# Patient Record
Sex: Female | Born: 1955
Health system: Southern US, Community
[De-identification: ages and names within clinical notes are randomized; demographics above are authoritative.]

## PROBLEM LIST (undated history)

## (undated) DIAGNOSIS — I10 Essential (primary) hypertension: Secondary | ICD-10-CM

---

## 2016-12-21 DIAGNOSIS — B001 Herpesviral vesicular dermatitis: Secondary | ICD-10-CM | POA: Diagnosis not present

## 2016-12-21 DIAGNOSIS — M545 Low back pain: Secondary | ICD-10-CM | POA: Diagnosis not present

## 2017-08-08 DIAGNOSIS — J101 Influenza due to other identified influenza virus with other respiratory manifestations: Secondary | ICD-10-CM | POA: Diagnosis not present

## 2018-08-14 ENCOUNTER — Emergency Department (HOSPITAL_COMMUNITY)
Admission: EM | Admit: 2018-08-14 | Discharge: 2018-08-14 | Disposition: A | Discharge status: Home / Self Care | Payer: BLUE CROSS/BLUE SHIELD | Attending: Emergency Medicine | Admitting: Emergency Medicine

## 2018-08-14 ENCOUNTER — Emergency Department (HOSPITAL_COMMUNITY): Payer: BLUE CROSS/BLUE SHIELD

## 2018-08-14 ENCOUNTER — Encounter (HOSPITAL_COMMUNITY): Payer: Self-pay

## 2018-08-14 ENCOUNTER — Other Ambulatory Visit: Payer: Self-pay

## 2018-08-14 DIAGNOSIS — I10 Essential (primary) hypertension: Secondary | ICD-10-CM | POA: Diagnosis not present

## 2018-08-14 DIAGNOSIS — R4182 Altered mental status, unspecified: Secondary | ICD-10-CM | POA: Insufficient documentation

## 2018-08-14 HISTORY — DX: Essential (primary) hypertension: I10

## 2018-08-14 LAB — COMPREHENSIVE METABOLIC PANEL
ALK PHOS: 123 U/L (ref 38–126)
ALT: 24 U/L (ref 0–44)
AST: 26 U/L (ref 15–41)
Albumin: 4.6 g/dL (ref 3.5–5.0)
Anion gap: 13 (ref 5–15)
BUN: 12 mg/dL (ref 8–23)
CO2: 24 mmol/L (ref 22–32)
Calcium: 9.9 mg/dL (ref 8.9–10.3)
Chloride: 103 mmol/L (ref 98–111)
Creatinine, Ser: 0.72 mg/dL (ref 0.44–1.00)
GFR calc Af Amer: >60 mL/min (ref >60)
GFR calc non Af Amer: >60 mL/min (ref >60)
Glucose, Bld: 108 mg/dL — ABNORMAL HIGH (ref 70–99)
Potassium: 4.5 mmol/L (ref 3.5–5.1)
Sodium: 140 mmol/L (ref 135–145)
TOTAL PROTEIN: 7.7 g/dL (ref 6.5–8.1)
Total Bilirubin: 0.6 mg/dL (ref 0.3–1.2)

## 2018-08-14 LAB — CBC WITH DIFFERENTIAL/PLATELET
Abs Immature Granulocytes: 0.03 10*3/uL (ref 0.00–0.07)
BASOS PCT: 0 %
Basophils Absolute: 0 10*3/uL (ref 0.0–0.1)
Eosinophils Absolute: 0.1 10*3/uL (ref 0.0–0.5)
Eosinophils Relative: 1 %
HCT: 49 % — ABNORMAL HIGH (ref 36.0–46.0)
Hemoglobin: 16.1 g/dL — ABNORMAL HIGH (ref 12.0–15.0)
Immature Granulocytes: 0 %
Lymphocytes Relative: 17 %
Lymphs Abs: 1.4 10*3/uL (ref 0.7–4.0)
MCH: 28.5 pg (ref 26.0–34.0)
MCHC: 32.9 g/dL (ref 30.0–36.0)
MCV: 86.7 fL (ref 80.0–100.0)
Monocytes Absolute: 0.6 10*3/uL (ref 0.1–1.0)
Monocytes Relative: 7 %
Neutro Abs: 6.3 10*3/uL (ref 1.7–7.7)
Neutrophils Relative %: 75 %
Platelets: 303 10*3/uL (ref 150–400)
RBC: 5.65 MIL/uL — ABNORMAL HIGH (ref 3.87–5.11)
RDW: 13.2 % (ref 11.5–15.5)
WBC: 8.4 10*3/uL (ref 4.0–10.5)
nRBC: 0 % (ref 0.0–0.2)

## 2018-08-14 LAB — CBG MONITORING, ED
Glucose-Capillary: 104 mg/dL — ABNORMAL HIGH (ref 70–99)
Glucose-Capillary: 82 mg/dL (ref 70–99)

## 2018-08-14 LAB — URINALYSIS, ROUTINE W REFLEX MICROSCOPIC
Bacteria, UA: NONE SEEN
Bilirubin Urine: NEGATIVE
Glucose, UA: NEGATIVE mg/dL
Hgb urine dipstick: NEGATIVE
Ketones, ur: 5 mg/dL — AB
Leukocytes,Ua: NEGATIVE
Nitrite: NEGATIVE
PROTEIN: 30 mg/dL — AB
Specific Gravity, Urine: 1.012 (ref 1.005–1.030)
pH: 8 (ref 5.0–8.0)

## 2018-08-14 LAB — RAPID URINE DRUG SCREEN, HOSP PERFORMED
Amphetamines: NOT DETECTED
BENZODIAZEPINES: NOT DETECTED
Barbiturates: NOT DETECTED
COCAINE: NOT DETECTED
Opiates: NOT DETECTED
Tetrahydrocannabinol: NOT DETECTED

## 2018-08-14 MED ORDER — SODIUM CHLORIDE 0.9 % IV SOLN
INTRAVENOUS | Status: DC
  Administered 2018-08-14: 15:00:00 via INTRAVENOUS

## 2018-08-14 MED ORDER — HYDROCHLOROTHIAZIDE 25 MG PO TABS: 25.0000 mg | ORAL_TABLET | Freq: Every day | ORAL | 1 refills | Status: DC

## 2018-08-14 NOTE — ED Notes (Signed)
Patient transported to CT 

## 2018-08-14 NOTE — ED Provider Notes (Addendum)
[facility] EMERGENCY DEPARTMENT Provider Note   CSN: [id number] Arrival date & time: 08/14/18  1410    History   Chief Complaint Chief Complaint  Patient presents with  . Altered Mental Status  . Hypertension    HPI Miranda Decker is a 62 y.o. female.     Patient brought in by EMS.  Patient last seen normal by her husband around 2300 last evening.  Patient contacted him her family members contact him at work that she seemed to be confused.  This was at 1030 this morning.  Apparently been calling family members.  Patient denies headache denies any visual changes speech problems.  She states that she feels confused.  Denies any weakness or sensory problems.  No chest pain no shortness of breath no abdominal pain.  No history of similar symptoms no new medications.  Patient is alert and will follow commands speech seems to be normal.     Past Medical History:  Diagnosis Date  . Hypertension     There are no active problems to display for this patient.   History reviewed. No pertinent surgical history.   OB History   No obstetric history on file.      Home Medications    Prior to Admission medications   Not on File    Family History No family history on file.  Social History Social History   Tobacco Use  . Smoking status: Never Smoker  Substance Use Topics  . Alcohol use: Never    Frequency: Never  . Drug use: Never     Allergies   Patient has no allergy information on record.   Review of Systems Review of Systems  Constitutional: Negative for chills and fever.  HENT: Negative for congestion, rhinorrhea and sore throat.   Eyes: Negative for photophobia, pain, redness and visual disturbance.  Respiratory: Negative for cough and shortness of breath.   Cardiovascular: Negative for chest pain and leg swelling.  Gastrointestinal: Negative for abdominal pain, diarrhea, nausea and vomiting.  Genitourinary: Negative for dysuria.   Musculoskeletal: Negative for back pain and neck pain.  Skin: Negative for rash.  Neurological: Negative for dizziness, syncope, facial asymmetry, speech difficulty, weakness, light-headedness, numbness and headaches.  Hematological: Does not bruise/bleed easily.  Psychiatric/Behavioral: Positive for confusion.     Physical Exam Updated Vital Signs BP (!) 206/98   Pulse 80   Temp 98.1 F (36.7 C) (Oral)   Resp 12   Ht 1.575 m (5\' 2" )   Wt 72.6 kg   SpO2 99%   BMI 29.26 kg/m   Physical Exam Vitals signs and nursing note reviewed.  Constitutional:      General: She is not in acute distress.    Appearance: She is well-developed.  HENT:     Head: Normocephalic and atraumatic.     Mouth/Throat:     Mouth: Mucous membranes are moist.  Eyes:     Extraocular Movements: Extraocular movements intact.     Conjunctiva/sclera: Conjunctivae normal.     Pupils: Pupils are equal, round, and reactive to light.  Neck:     Musculoskeletal: Normal range of motion and neck supple.  Cardiovascular:     Rate and Rhythm: Normal rate and regular rhythm.     Heart sounds: Normal heart sounds. No murmur.  Pulmonary:     Effort: Pulmonary effort is normal. No respiratory distress.     Breath sounds: Normal breath sounds.  Abdominal:     General: Bowel sounds are  normal.     Palpations: Abdomen is soft.     Tenderness: There is no abdominal tenderness.  Musculoskeletal: Normal range of motion.  Skin:    General: Skin is warm and dry.     Capillary Refill: Capillary refill takes less than 2 seconds.  Neurological:     General: No focal deficit present.     Mental Status: She is alert and oriented to person, place, and time.     Cranial Nerves: No cranial nerve deficit.     Sensory: No sensory deficit.     Motor: No weakness.     Coordination: Coordination normal.      ED Treatments / Results  Labs (all labs ordered are listed, but only abnormal results are displayed) Labs Reviewed   URINALYSIS, ROUTINE W REFLEX MICROSCOPIC - Abnormal; Notable for the following components:      Result Value   Ketones, ur 5 (*)    Protein, ur 30 (*)    All other components within normal limits  CBC WITH DIFFERENTIAL/PLATELET - Abnormal; Notable for the following components:   RBC 5.65 (*)    Hemoglobin 16.1 (*)    HCT 49.0 (*)    All other components within normal limits  RAPID URINE DRUG SCREEN, HOSP PERFORMED  COMPREHENSIVE METABOLIC PANEL  CBG MONITORING, ED    EKG None  Radiology Dg Chest 2 View  Result Date: 08/14/2018 CLINICAL DATA:  63 year old female with a history confusion EXAM: CHEST - 2 VIEW COMPARISON:  None. FINDINGS: The heart size and mediastinal contours are within normal limits. Both lungs are clear. The visualized skeletal structures are unremarkable. IMPRESSION: No active cardiopulmonary disease. Electronically Signed   By: Gilmer MorJaime  Wagner D.O.   On: 08/14/2018 15:14   Ct Head Wo Contrast  Result Date: 08/14/2018 CLINICAL DATA:  Altered level of consciousness EXAM: CT HEAD WITHOUT CONTRAST TECHNIQUE: Contiguous axial images were obtained from the base of the skull through the vertex without intravenous contrast. COMPARISON:  None. FINDINGS: Brain: No evidence of acute infarction, hemorrhage, hydrocephalus, extra-axial collection or mass lesion/mass effect. Vascular: No hyperdense vessel or unexpected calcification. Skull: Normal. Negative for fracture or focal lesion. Sinuses/Orbits: No acute finding. Other: None. IMPRESSION: Normal head CT Electronically Signed   By: Alcide CleverMark  Lukens M.D.   On: 08/14/2018 15:06    Procedures Procedures (including critical care time)  Medications Ordered in ED Medications  0.9 %  sodium chloride infusion ( Intravenous New Bag/Given 08/14/18 1525)     Initial Impression / Assessment and Plan / ED Course  I have reviewed the triage vital signs and the nursing notes.  Pertinent labs & imaging results that were available during my  care of the patient were reviewed by me and considered in my medical decision making (see chart for details).       Patient with altered mental status but upon arrival here was alert with follow commands.  Patient states she felt confused and head.  Head CT without any acute findings.  Patient has a history of hypertension.  Blood pressure is elevated here 235/115.   Work-up without any acute findings CBC no leukocytosis chest x-ray negative head CT negative urine drug screen negative urinalysis negative for urinary tract infection.  Blood pressure showing signs of improvement.  Without any interaction.  200/98.  Patient states she has a history of hypertension.  Trying to figure out what medication she is supposed to be on.  Complete metabolic panel is pending.  Final Clinical Impressions(s) /  ED Diagnoses   Final diagnoses:  Altered mental status, unspecified altered mental status type  Essential hypertension    ED Discharge Orders    None       Vanetta Mulders, MD 08/14/18 1550    Vanetta Mulders, MD 08/14/18 1551    Vanetta Mulders, MD 08/14/18 1551  Addendum:  Patient states she used to be on lisinopril long time ago but she had bad side effects from it.  Stated dropped her pressures too rapidly so she is avoided taking any blood pressure since.  Based on this we will avoid that will start her on hydrochlorothiazide assuming that her electrolytes are come back as normal.  Patient does have potential follow-up in the [facility] area and realize that she will have to have this blood pressure followed up.  Patient's mental status is markedly improved.  She does seem to be concerned that she possibly may have drank too much NyQuil last night.  This could be playing some role with her confusion.  Did let her know that if this gets to be a recurrent issue with the confusion she will need MRI of the brain.  If any new or worse symptoms she will need to return.     Vanetta Mulders, MD 08/14/18 [phone]

## 2018-08-14 NOTE — ED Triage Notes (Signed)
Pt from home POV with husband for AMS and HTN. LSN was 11pm last night. Pt woke up around 1030 this morning, called her husband and said she felt confused. Pt alert and oriented to self and place. Pt disoriented to time and situation. No weakness, facial droop or slurred speech noted. Pt denies headache or dizziness. EDP at bedside

## 2018-08-14 NOTE — Discharge Instructions (Signed)
Start the hydrochlorothiazide for the elevated blood pressure.  Arrange follow-up in the [facility] area to have your blood pressure rechecked in a week or 2.  Return for any new or worse symptoms.  If you have further episodic developments of confusion you will need an MRI.  Today's work-up as we talked about chest x-ray head CT without any acute findings.  Feel that the blood pressure probably has been elevated for a while and since it has not significantly come down and the confusion has improved feel that is probably not playing a role.

## 2018-08-22 DIAGNOSIS — I10 Essential (primary) hypertension: Secondary | ICD-10-CM | POA: Diagnosis not present

## 2018-08-22 DIAGNOSIS — R509 Fever, unspecified: Secondary | ICD-10-CM | POA: Diagnosis not present

## 2018-08-24 DIAGNOSIS — R509 Fever, unspecified: Secondary | ICD-10-CM | POA: Diagnosis not present

## 2018-08-24 DIAGNOSIS — I10 Essential (primary) hypertension: Secondary | ICD-10-CM | POA: Diagnosis not present

## 2018-08-24 DIAGNOSIS — R0602 Shortness of breath: Secondary | ICD-10-CM | POA: Diagnosis not present

## 2018-08-24 DIAGNOSIS — R05 Cough: Secondary | ICD-10-CM | POA: Diagnosis not present

## 2018-08-24 DIAGNOSIS — Z8249 Family history of ischemic heart disease and other diseases of the circulatory system: Secondary | ICD-10-CM | POA: Diagnosis not present

## 2018-08-29 DIAGNOSIS — I6523 Occlusion and stenosis of bilateral carotid arteries: Secondary | ICD-10-CM | POA: Diagnosis not present

## 2018-08-29 DIAGNOSIS — R509 Fever, unspecified: Secondary | ICD-10-CM | POA: Diagnosis not present

## 2018-08-29 DIAGNOSIS — R42 Dizziness and giddiness: Secondary | ICD-10-CM | POA: Diagnosis not present

## 2018-08-29 DIAGNOSIS — R413 Other amnesia: Secondary | ICD-10-CM | POA: Diagnosis not present

## 2018-08-29 DIAGNOSIS — I10 Essential (primary) hypertension: Secondary | ICD-10-CM | POA: Diagnosis not present

## 2018-08-29 DIAGNOSIS — I6529 Occlusion and stenosis of unspecified carotid artery: Secondary | ICD-10-CM | POA: Diagnosis not present

## 2018-08-29 DIAGNOSIS — J322 Chronic ethmoidal sinusitis: Secondary | ICD-10-CM | POA: Diagnosis not present

## 2018-08-29 DIAGNOSIS — I517 Cardiomegaly: Secondary | ICD-10-CM | POA: Diagnosis not present

## 2018-08-29 DIAGNOSIS — I350 Nonrheumatic aortic (valve) stenosis: Secondary | ICD-10-CM | POA: Diagnosis not present

## 2018-09-17 DIAGNOSIS — Z6833 Body mass index (BMI) 33.0-33.9, adult: Secondary | ICD-10-CM | POA: Diagnosis not present

## 2018-09-17 DIAGNOSIS — I10 Essential (primary) hypertension: Secondary | ICD-10-CM | POA: Diagnosis not present

## 2018-11-20 DIAGNOSIS — Z1389 Encounter for screening for other disorder: Secondary | ICD-10-CM | POA: Diagnosis not present

## 2018-11-20 DIAGNOSIS — Z6833 Body mass index (BMI) 33.0-33.9, adult: Secondary | ICD-10-CM | POA: Diagnosis not present

## 2018-11-20 DIAGNOSIS — R5383 Other fatigue: Secondary | ICD-10-CM | POA: Diagnosis not present

## 2018-11-20 DIAGNOSIS — I10 Essential (primary) hypertension: Secondary | ICD-10-CM | POA: Diagnosis not present

## 2018-12-18 DIAGNOSIS — Z209 Contact with and (suspected) exposure to unspecified communicable disease: Secondary | ICD-10-CM | POA: Diagnosis not present

## 2018-12-18 DIAGNOSIS — H8110 Benign paroxysmal vertigo, unspecified ear: Secondary | ICD-10-CM | POA: Diagnosis not present

## 2018-12-18 DIAGNOSIS — Z6833 Body mass index (BMI) 33.0-33.9, adult: Secondary | ICD-10-CM | POA: Diagnosis not present

## 2018-12-18 DIAGNOSIS — I10 Essential (primary) hypertension: Secondary | ICD-10-CM | POA: Diagnosis not present

## 2018-12-24 DIAGNOSIS — I10 Essential (primary) hypertension: Secondary | ICD-10-CM | POA: Diagnosis not present

## 2018-12-24 DIAGNOSIS — R42 Dizziness and giddiness: Secondary | ICD-10-CM | POA: Diagnosis not present

## 2018-12-24 DIAGNOSIS — E785 Hyperlipidemia, unspecified: Secondary | ICD-10-CM | POA: Diagnosis not present

## 2018-12-26 DIAGNOSIS — E785 Hyperlipidemia, unspecified: Secondary | ICD-10-CM | POA: Diagnosis not present

## 2018-12-26 DIAGNOSIS — I10 Essential (primary) hypertension: Secondary | ICD-10-CM | POA: Diagnosis not present

## 2018-12-26 DIAGNOSIS — R42 Dizziness and giddiness: Secondary | ICD-10-CM | POA: Diagnosis not present

## 2019-01-02 DIAGNOSIS — R42 Dizziness and giddiness: Secondary | ICD-10-CM | POA: Diagnosis not present

## 2019-01-02 DIAGNOSIS — I10 Essential (primary) hypertension: Secondary | ICD-10-CM | POA: Diagnosis not present

## 2019-01-02 DIAGNOSIS — E785 Hyperlipidemia, unspecified: Secondary | ICD-10-CM | POA: Diagnosis not present

## 2019-10-28 DIAGNOSIS — J029 Acute pharyngitis, unspecified: Secondary | ICD-10-CM | POA: Diagnosis not present

## 2019-10-28 DIAGNOSIS — J3489 Other specified disorders of nose and nasal sinuses: Secondary | ICD-10-CM | POA: Diagnosis not present

## 2019-10-29 DIAGNOSIS — Z03818 Encounter for observation for suspected exposure to other biological agents ruled out: Secondary | ICD-10-CM | POA: Diagnosis not present

## 2019-10-29 DIAGNOSIS — Z20822 Contact with and (suspected) exposure to covid-19: Secondary | ICD-10-CM | POA: Diagnosis not present

## 2019-10-29 DIAGNOSIS — B349 Viral infection, unspecified: Secondary | ICD-10-CM | POA: Diagnosis not present

## 2020-04-22 IMAGING — CT CT HEAD WITHOUT CONTRAST
3 series · 15 of 47 positions shown, 18 images · non-contrast
Comparison: None.

CLINICAL DATA: Altered level of consciousness

EXAM:
CT HEAD WITHOUT CONTRAST
TECHNIQUE: Contiguous axial images were obtained from the base of the skull
through the vertex without intravenous contrast.

[Series 3: head 5.0 h30s · axial · 0.41mm/px · z∈[-182,-52]mm · 9 of 32 slices shown, 12 images]
[im 3/32  brain]
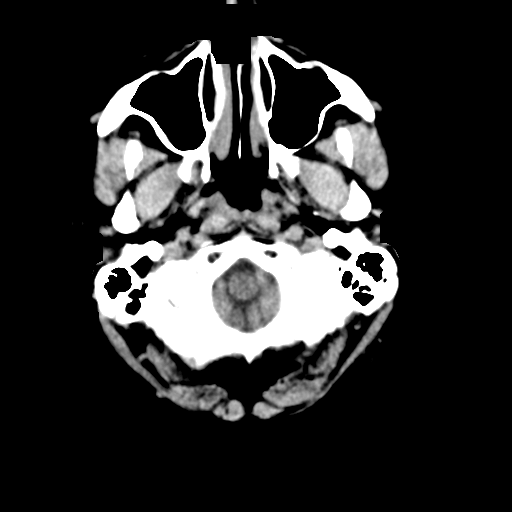
[im 3/32  bone]
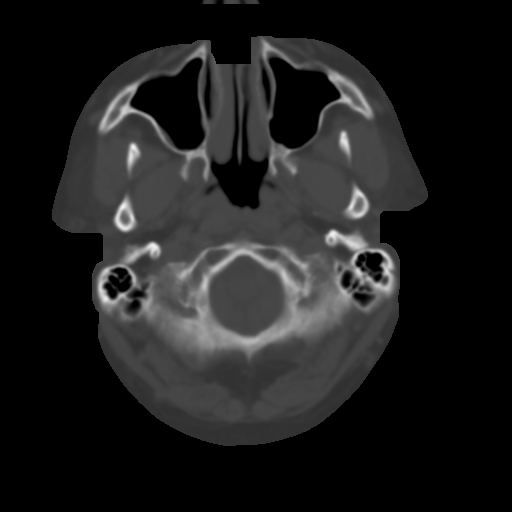
[im 6/32  brain]
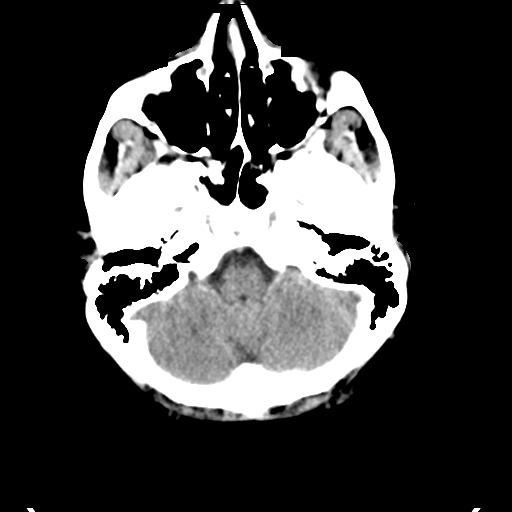
[im 9/32  brain]
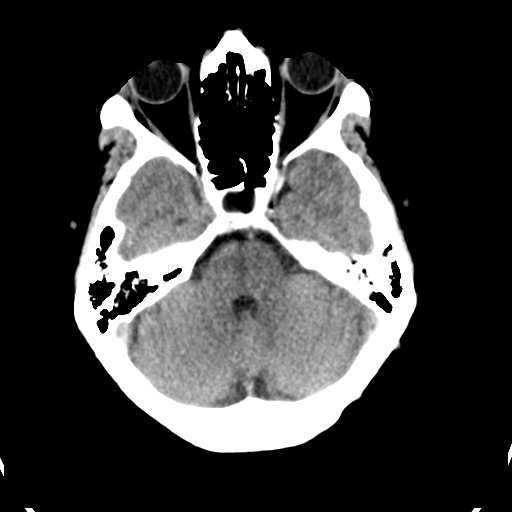
[im 12/32  brain]
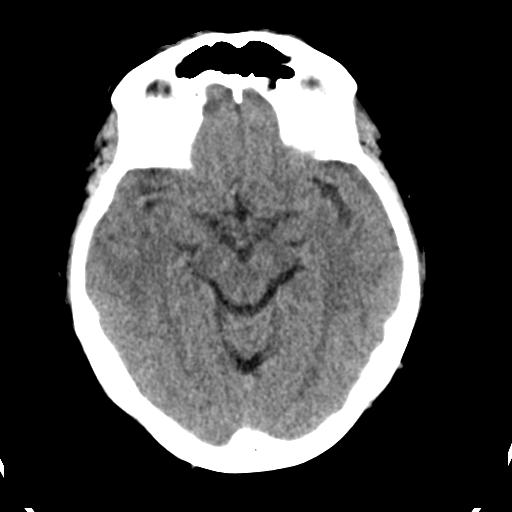
[im 17/32  brain]
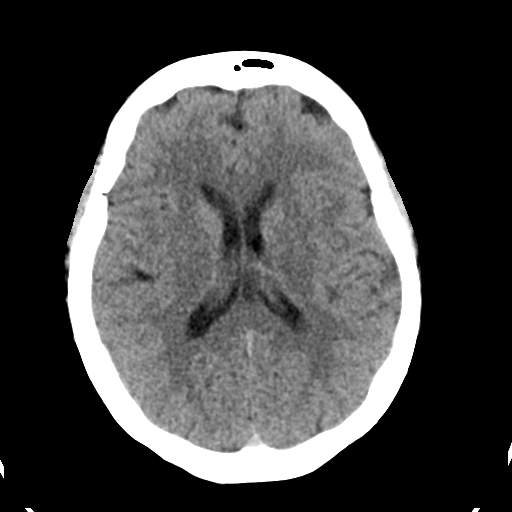
[im 17/32  bone]
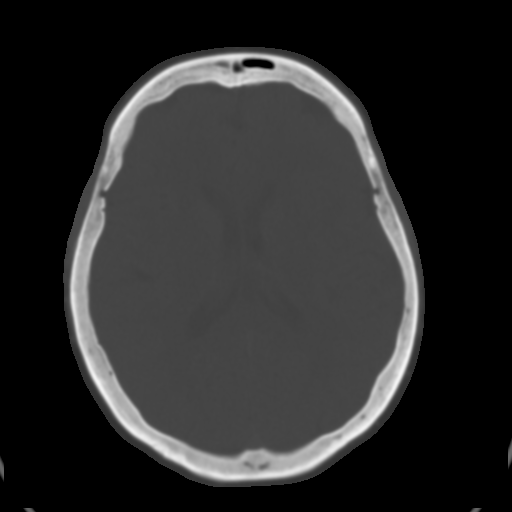
[im 20/32  brain]
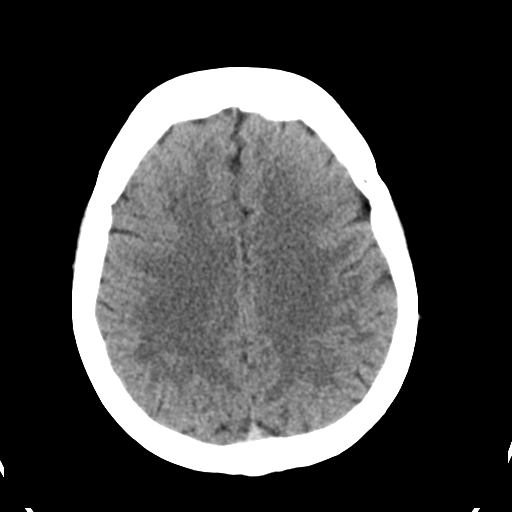
[im 23/32  brain]
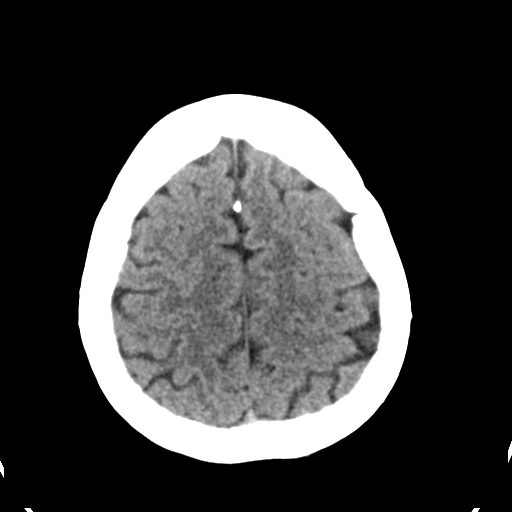
[im 26/32  brain]
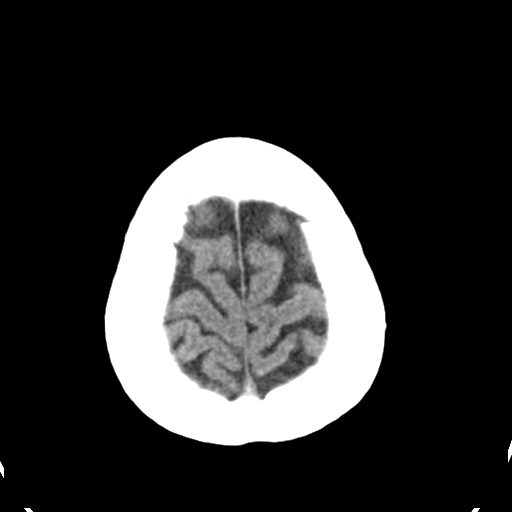
[im 29/32  brain]
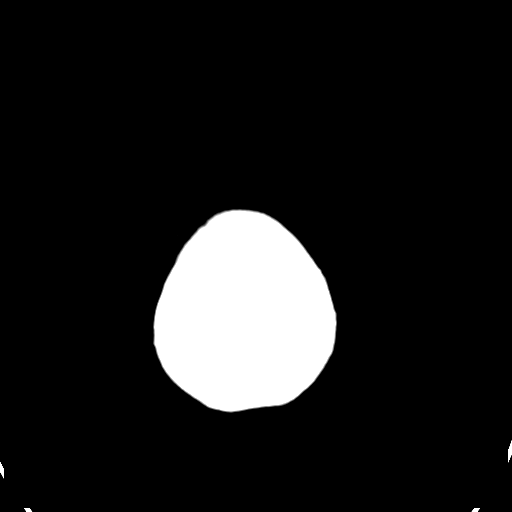
[im 29/32  bone]
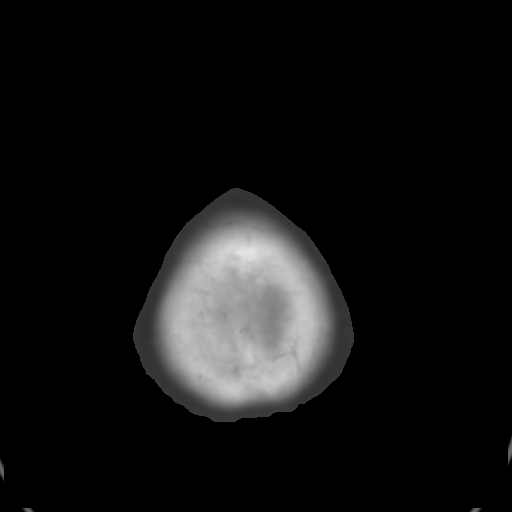

[Series 5: head 3.0 mpr cor · coronal · 0.30mm/px · 3 of 67 slices shown]
[im 23/67  brain]
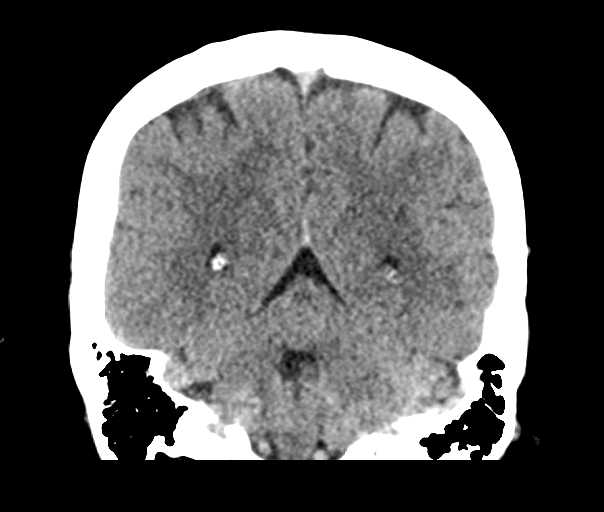
[im 30/67  brain]
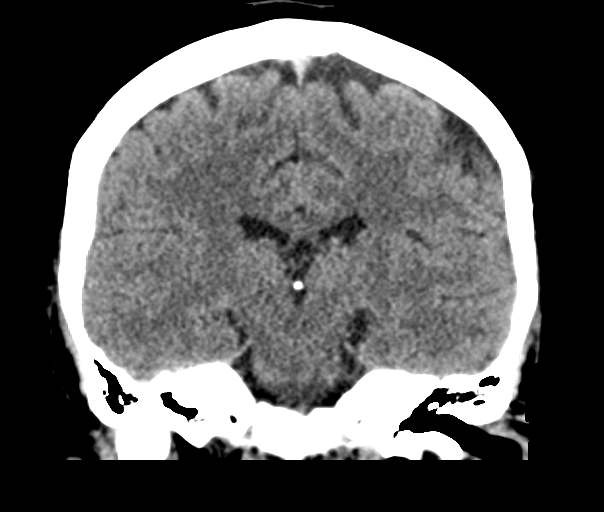
[im 37/67  brain]
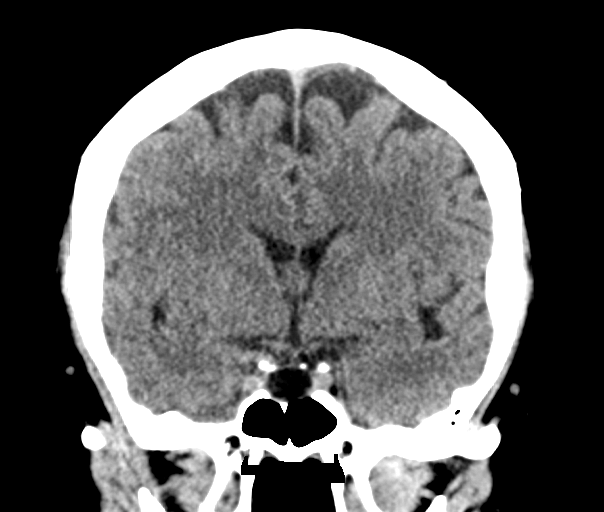

[Series 6: head 3.0 mpr sag · sagittal · 0.30mm/px · 3 of 67 slices shown]
[im 23/67  brain]
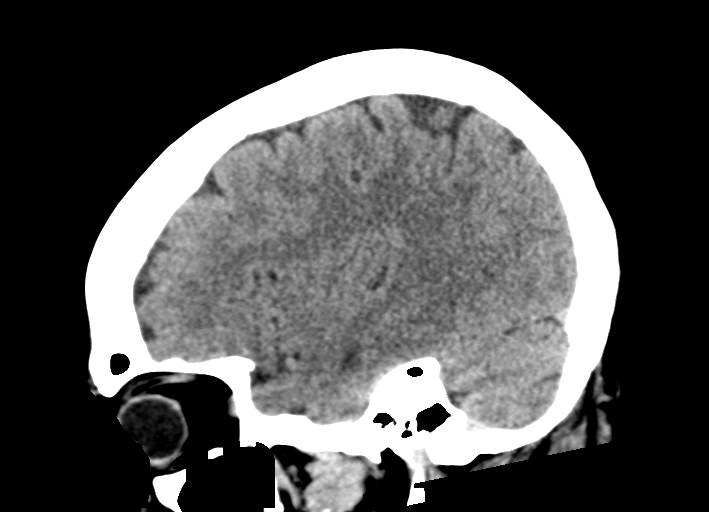
[im 34/67  brain]
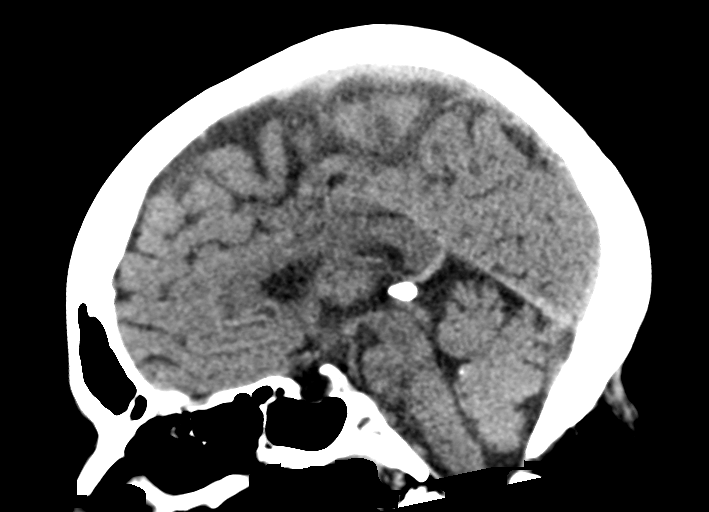
[im 45/67  brain]
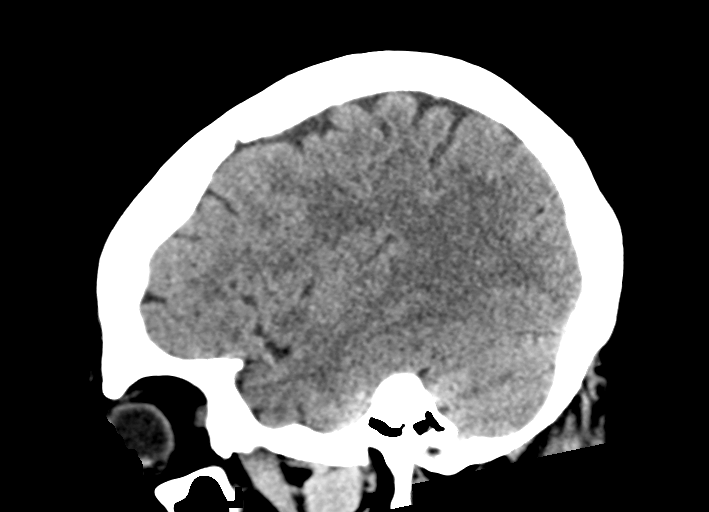

[15 of 47 positions shown; findings below may reference images not displayed]

FINDINGS: Brain: No evidence of acute infarction, hemorrhage, hydrocephalus,
extra-axial collection or mass lesion/mass effect.

Vascular: No hyperdense vessel or unexpected calcification.

Skull: Normal. Negative for fracture or focal lesion.

Sinuses/Orbits: No acute finding.

Other: None.
IMPRESSION: Normal head CT

## 2020-04-22 IMAGING — CR CHEST - 2 VIEW
2 series · 2 of 2 positions shown · non-contrast
Comparison: None.

CLINICAL DATA: 63-year-old female with a history confusion

EXAM:
CHEST - 2 VIEW

[chest lat]
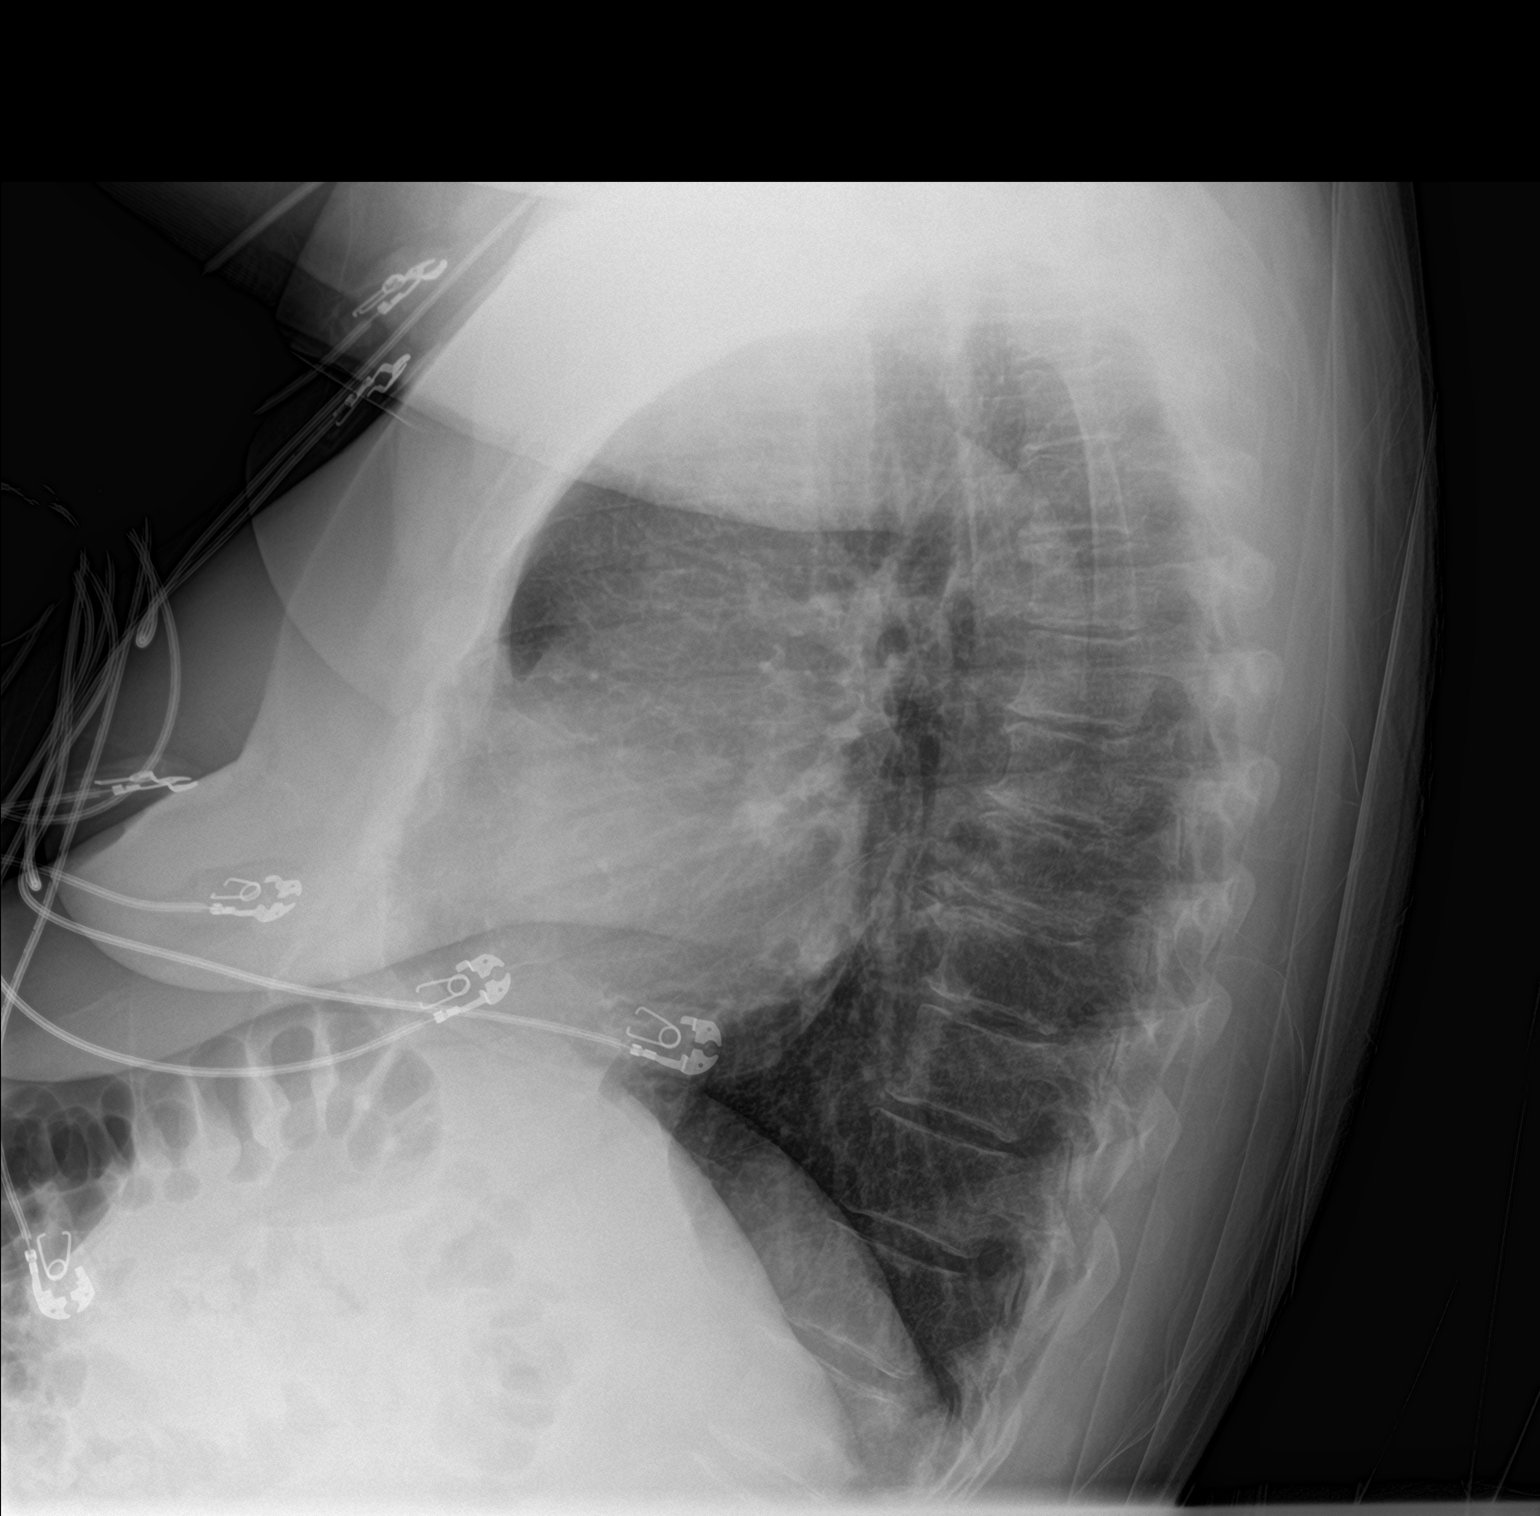

[chest ap]
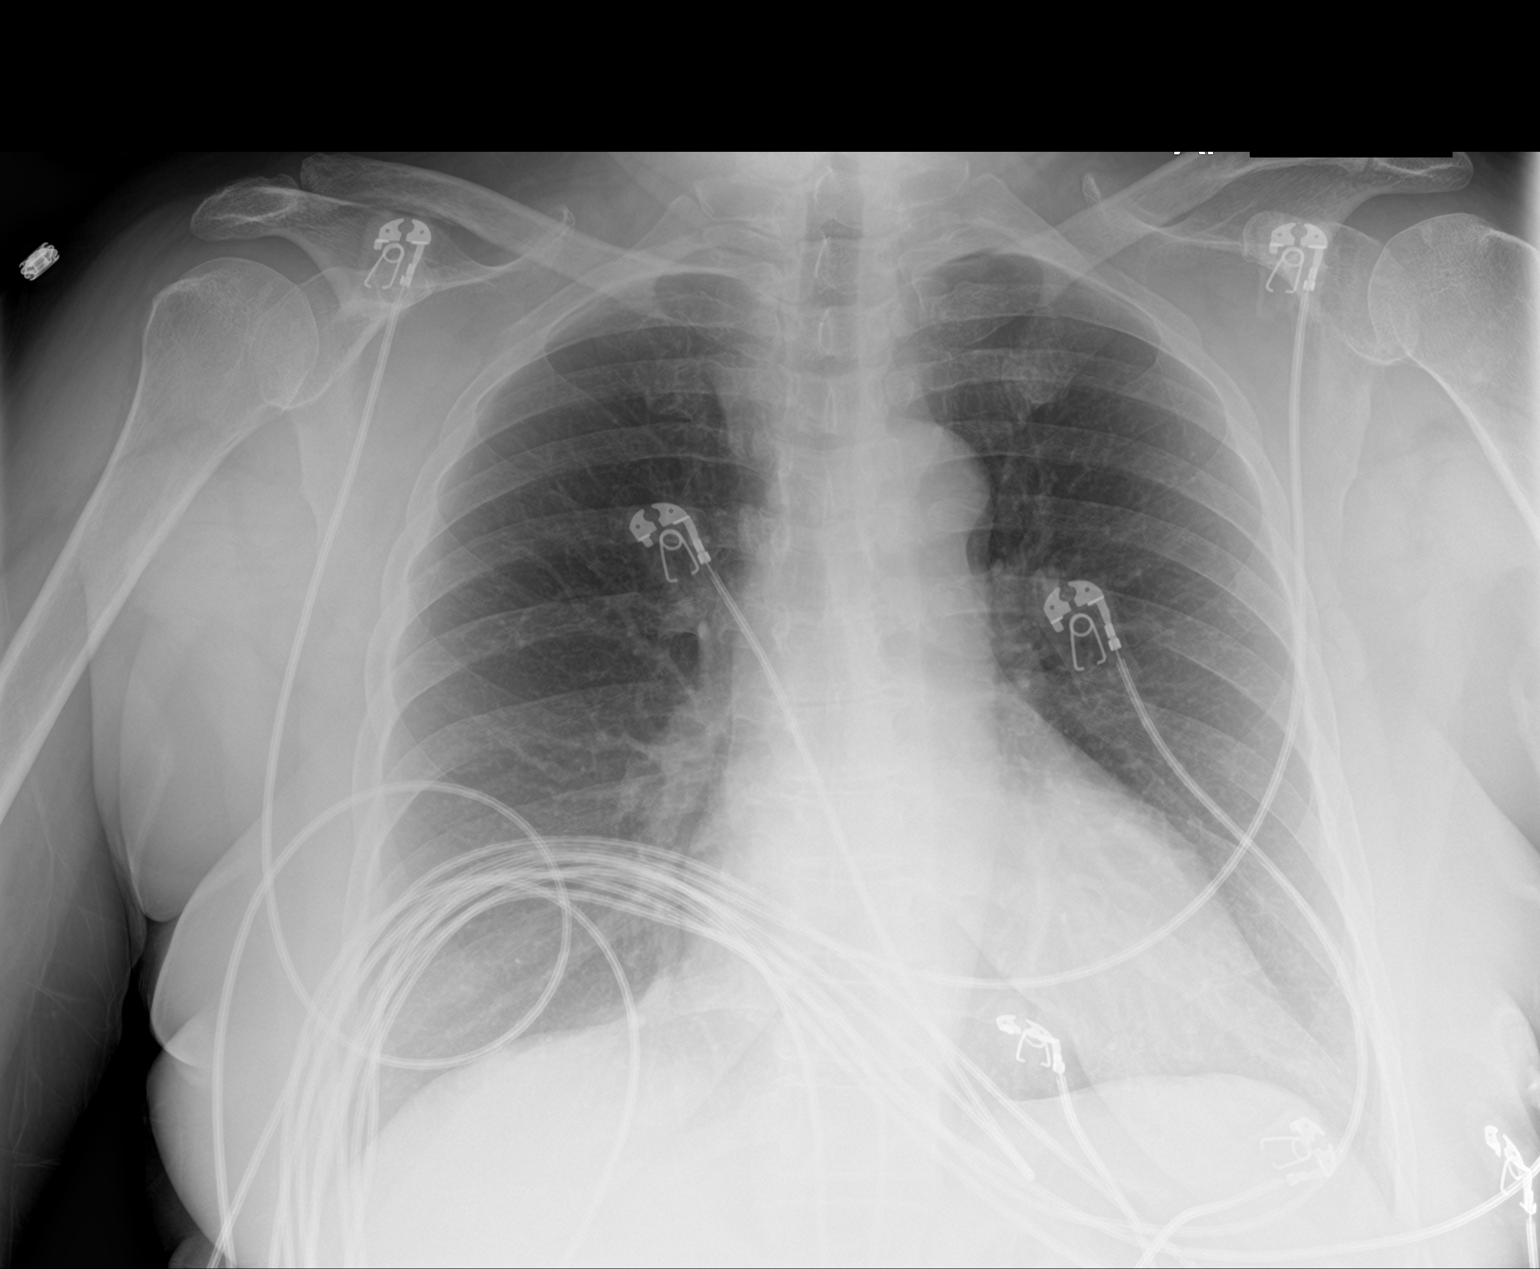

[2 of 2 positions shown; findings below may reference images not displayed]

FINDINGS: The heart size and mediastinal contours are within normal limits.
Both lungs are clear. The visualized skeletal structures are
unremarkable.
IMPRESSION: No active cardiopulmonary disease.

## 2022-06-01 DIAGNOSIS — R053 Chronic cough: Secondary | ICD-10-CM | POA: Diagnosis not present

## 2023-02-06 DIAGNOSIS — Z6831 Body mass index (BMI) 31.0-31.9, adult: Secondary | ICD-10-CM | POA: Diagnosis not present

## 2023-02-06 DIAGNOSIS — E669 Obesity, unspecified: Secondary | ICD-10-CM | POA: Diagnosis not present

## 2023-02-06 DIAGNOSIS — I10 Essential (primary) hypertension: Secondary | ICD-10-CM | POA: Diagnosis not present

## 2023-02-06 DIAGNOSIS — M25562 Pain in left knee: Secondary | ICD-10-CM | POA: Diagnosis not present

## 2023-05-31 DIAGNOSIS — Z20822 Contact with and (suspected) exposure to covid-19: Secondary | ICD-10-CM | POA: Diagnosis not present

## 2023-05-31 DIAGNOSIS — R051 Acute cough: Secondary | ICD-10-CM | POA: Diagnosis not present

## 2023-05-31 DIAGNOSIS — J205 Acute bronchitis due to respiratory syncytial virus: Secondary | ICD-10-CM | POA: Diagnosis not present

## 2023-05-31 DIAGNOSIS — H6693 Otitis media, unspecified, bilateral: Secondary | ICD-10-CM | POA: Diagnosis not present

## 2023-08-01 DIAGNOSIS — K080 Exfoliation of teeth due to systemic causes: Secondary | ICD-10-CM | POA: Diagnosis not present

## 2023-08-22 DIAGNOSIS — K080 Exfoliation of teeth due to systemic causes: Secondary | ICD-10-CM | POA: Diagnosis not present

## 2023-10-25 ENCOUNTER — Encounter (HOSPITAL_COMMUNITY): Payer: Self-pay | Admitting: Emergency Medicine

## 2023-10-25 ENCOUNTER — Emergency Department (HOSPITAL_COMMUNITY)

## 2023-10-25 ENCOUNTER — Other Ambulatory Visit: Payer: Self-pay

## 2023-10-25 ENCOUNTER — Inpatient Hospital Stay (HOSPITAL_COMMUNITY)
Admission: EM | Admit: 2023-10-25 | Discharge: 2023-10-30 | DRG: 866 | Disposition: A | Discharge status: Home / Self Care | Attending: Internal Medicine | Admitting: Internal Medicine

## 2023-10-25 DIAGNOSIS — B349 Viral infection, unspecified: Secondary | ICD-10-CM | POA: Diagnosis not present

## 2023-10-25 DIAGNOSIS — I160 Hypertensive urgency: Principal | ICD-10-CM | POA: Diagnosis present

## 2023-10-25 DIAGNOSIS — Z6830 Body mass index (BMI) 30.0-30.9, adult: Secondary | ICD-10-CM

## 2023-10-25 DIAGNOSIS — R509 Fever, unspecified: Secondary | ICD-10-CM | POA: Diagnosis present

## 2023-10-25 DIAGNOSIS — E669 Obesity, unspecified: Secondary | ICD-10-CM | POA: Diagnosis present

## 2023-10-25 DIAGNOSIS — R7881 Bacteremia: Secondary | ICD-10-CM

## 2023-10-25 DIAGNOSIS — Z79899 Other long term (current) drug therapy: Secondary | ICD-10-CM | POA: Diagnosis not present

## 2023-10-25 DIAGNOSIS — Z1152 Encounter for screening for COVID-19: Secondary | ICD-10-CM | POA: Diagnosis not present

## 2023-10-25 DIAGNOSIS — R21 Rash and other nonspecific skin eruption: Secondary | ICD-10-CM | POA: Diagnosis not present

## 2023-10-25 DIAGNOSIS — D696 Thrombocytopenia, unspecified: Secondary | ICD-10-CM | POA: Diagnosis present

## 2023-10-25 DIAGNOSIS — Z888 Allergy status to other drugs, medicaments and biological substances status: Secondary | ICD-10-CM

## 2023-10-25 DIAGNOSIS — E785 Hyperlipidemia, unspecified: Secondary | ICD-10-CM | POA: Diagnosis present

## 2023-10-25 DIAGNOSIS — K5730 Diverticulosis of large intestine without perforation or abscess without bleeding: Secondary | ICD-10-CM | POA: Diagnosis not present

## 2023-10-25 DIAGNOSIS — A419 Sepsis, unspecified organism: Secondary | ICD-10-CM | POA: Diagnosis not present

## 2023-10-25 DIAGNOSIS — Z8616 Personal history of COVID-19: Secondary | ICD-10-CM | POA: Diagnosis not present

## 2023-10-25 DIAGNOSIS — I10 Essential (primary) hypertension: Secondary | ICD-10-CM | POA: Diagnosis present

## 2023-10-25 DIAGNOSIS — E876 Hypokalemia: Secondary | ICD-10-CM | POA: Diagnosis present

## 2023-10-25 DIAGNOSIS — R918 Other nonspecific abnormal finding of lung field: Secondary | ICD-10-CM | POA: Diagnosis not present

## 2023-10-25 LAB — URINALYSIS, ROUTINE W REFLEX MICROSCOPIC
Bilirubin Urine: NEGATIVE
Glucose, UA: NEGATIVE mg/dL
Hgb urine dipstick: NEGATIVE
Ketones, ur: NEGATIVE mg/dL
Leukocytes,Ua: NEGATIVE
Nitrite: NEGATIVE
Protein, ur: NEGATIVE mg/dL
Specific Gravity, Urine: 1.004 — ABNORMAL LOW (ref 1.005–1.030)
pH: 6 (ref 5.0–8.0)

## 2023-10-25 LAB — COMPREHENSIVE METABOLIC PANEL WITH GFR
ALT: 26 U/L (ref 0–44)
AST: 28 U/L (ref 15–41)
Albumin: 3.5 g/dL (ref 3.5–5.0)
Alkaline Phosphatase: 76 U/L (ref 38–126)
Anion gap: 12 (ref 5–15)
BUN: 14 mg/dL (ref 8–23)
CO2: 25 mmol/L (ref 22–32)
Calcium: 8.8 mg/dL — ABNORMAL LOW (ref 8.9–10.3)
Chloride: 100 mmol/L (ref 98–111)
Creatinine, Ser: 0.81 mg/dL (ref 0.44–1.00)
GFR, Estimated: >60 mL/min (ref >60)
Glucose, Bld: 112 mg/dL — ABNORMAL HIGH (ref 70–99)
Potassium: 3.4 mmol/L — ABNORMAL LOW (ref 3.5–5.1)
Sodium: 137 mmol/L (ref 135–145)
Total Bilirubin: 0.6 mg/dL (ref 0.0–1.2)
Total Protein: 6.6 g/dL (ref 6.5–8.1)

## 2023-10-25 LAB — CBC WITH DIFFERENTIAL/PLATELET
Abs Immature Granulocytes: 0 10*3/uL (ref 0.00–0.07)
Basophils Absolute: 0 10*3/uL (ref 0.0–0.1)
Basophils Relative: 0 %
Eosinophils Absolute: 0.1 10*3/uL (ref 0.0–0.5)
Eosinophils Relative: 3 %
HCT: 42.3 % (ref 36.0–46.0)
Hemoglobin: 13.8 g/dL (ref 12.0–15.0)
Immature Granulocytes: 0 %
Lymphocytes Relative: 13 %
Lymphs Abs: 0.6 10*3/uL — ABNORMAL LOW (ref 0.7–4.0)
MCH: 28.3 pg (ref 26.0–34.0)
MCHC: 32.6 g/dL (ref 30.0–36.0)
MCV: 86.9 fL (ref 80.0–100.0)
Monocytes Absolute: 0.2 10*3/uL (ref 0.1–1.0)
Monocytes Relative: 4 %
Neutro Abs: 3.4 10*3/uL (ref 1.7–7.7)
Neutrophils Relative %: 80 %
Platelets: 119 10*3/uL — ABNORMAL LOW (ref 150–400)
RBC: 4.87 MIL/uL (ref 3.87–5.11)
RDW: 12.9 % (ref 11.5–15.5)
WBC: 4.3 10*3/uL (ref 4.0–10.5)
nRBC: 0 % (ref 0.0–0.2)

## 2023-10-25 LAB — RESP PANEL BY RT-PCR (RSV, FLU A&B, COVID)  RVPGX2
Influenza A by PCR: NEGATIVE
Influenza B by PCR: NEGATIVE
Resp Syncytial Virus by PCR: NEGATIVE
SARS Coronavirus 2 by RT PCR: NEGATIVE

## 2023-10-25 LAB — LACTIC ACID, PLASMA
Lactic Acid, Venous: 0.9 mmol/L (ref 0.5–1.9)
Lactic Acid, Venous: 1.1 mmol/L (ref 0.5–1.9)

## 2023-10-25 LAB — PROCALCITONIN: Procalcitonin: 0.28 ng/mL

## 2023-10-25 MED ORDER — CLEVIDIPINE BUTYRATE 0.5 MG/ML IV EMUL
0.0000 mg/h | INTRAVENOUS | Status: DC
  Administered 2023-10-25 (×2): 2 mg/h via INTRAVENOUS
  Filled 2023-10-25 (×2): qty 50

## 2023-10-25 MED ORDER — ONDANSETRON HCL 4 MG PO TABS
4.0000 mg | ORAL_TABLET | Freq: Four times a day (QID) | ORAL | Status: DC | PRN
  Administered 2023-10-28 – 2023-10-29 (×2): 4 mg via ORAL
  Filled 2023-10-25 (×2): qty 1

## 2023-10-25 MED ORDER — METRONIDAZOLE 500 MG/100ML IV SOLN
500.0000 mg | Freq: Two times a day (BID) | INTRAVENOUS | Status: DC
  Administered 2023-10-25 – 2023-10-27 (×4): 500 mg via INTRAVENOUS
  Filled 2023-10-25 (×4): qty 100

## 2023-10-25 MED ORDER — POLYETHYLENE GLYCOL 3350 17 G PO PACK
17.0000 g | PACK | Freq: Every day | ORAL | Status: DC | PRN
Start: 2023-10-25 — End: 2023-10-30

## 2023-10-25 MED ORDER — AMLODIPINE BESYLATE 5 MG PO TABS
5.0000 mg | ORAL_TABLET | Freq: Once | ORAL | Status: AC
  Administered 2023-10-25: 5 mg via ORAL
  Filled 2023-10-25: qty 1

## 2023-10-25 MED ORDER — ACETAMINOPHEN 325 MG PO TABS
650.0000 mg | ORAL_TABLET | Freq: Four times a day (QID) | ORAL | Status: DC | PRN
  Administered 2023-10-26 – 2023-10-28 (×5): 650 mg via ORAL
  Filled 2023-10-25 (×5): qty 2

## 2023-10-25 MED ORDER — SODIUM CHLORIDE 0.9 % IV SOLN
2.0000 g | Freq: Three times a day (TID) | INTRAVENOUS | Status: DC
  Administered 2023-10-25 – 2023-10-27 (×6): 2 g via INTRAVENOUS
  Filled 2023-10-25 (×6): qty 12.5

## 2023-10-25 MED ORDER — POTASSIUM CHLORIDE CRYS ER 20 MEQ PO TBCR
40.0000 meq | EXTENDED_RELEASE_TABLET | Freq: Once | ORAL | Status: AC
  Administered 2023-10-25: 40 meq via ORAL
  Filled 2023-10-25: qty 2

## 2023-10-25 MED ORDER — ONDANSETRON HCL 4 MG/2ML IJ SOLN
4.0000 mg | Freq: Four times a day (QID) | INTRAMUSCULAR | Status: DC | PRN
  Administered 2023-10-27 – 2023-10-29 (×2): 4 mg via INTRAVENOUS
  Filled 2023-10-25 (×2): qty 2

## 2023-10-25 MED ORDER — ACETAMINOPHEN 500 MG PO TABS
1000.0000 mg | ORAL_TABLET | Freq: Once | ORAL | Status: AC
  Administered 2023-10-25: 1000 mg via ORAL
  Filled 2023-10-25: qty 2

## 2023-10-25 MED ORDER — DILTIAZEM HCL 25 MG/5ML IV SOLN
10.0000 mg | Freq: Once | INTRAVENOUS | Status: AC
  Administered 2023-10-25: 10 mg via INTRAVENOUS
  Filled 2023-10-25: qty 5

## 2023-10-25 MED ORDER — CHLORHEXIDINE GLUCONATE CLOTH 2 % EX PADS
6.0000 | MEDICATED_PAD | Freq: Every day | CUTANEOUS | Status: DC
  Administered 2023-10-25 – 2023-10-30 (×5): 6 via TOPICAL

## 2023-10-25 MED ORDER — AMLODIPINE BESYLATE 5 MG PO TABS
5.0000 mg | ORAL_TABLET | Freq: Every day | ORAL | Status: DC
  Administered 2023-10-26 – 2023-10-27 (×2): 5 mg via ORAL
  Filled 2023-10-25 (×2): qty 1

## 2023-10-25 MED ORDER — VANCOMYCIN HCL 1500 MG/300ML IV SOLN
1500.0000 mg | Freq: Once | INTRAVENOUS | Status: AC
  Administered 2023-10-25: 1500 mg via INTRAVENOUS
  Filled 2023-10-25: qty 300

## 2023-10-25 MED ORDER — ENOXAPARIN SODIUM 40 MG/0.4ML IJ SOSY
40.0000 mg | PREFILLED_SYRINGE | INTRAMUSCULAR | Status: DC
  Administered 2023-10-25 – 2023-10-29 (×5): 40 mg via SUBCUTANEOUS
  Filled 2023-10-25 (×5): qty 0.4

## 2023-10-25 MED ORDER — ONDANSETRON HCL 4 MG/2ML IJ SOLN
4.0000 mg | Freq: Once | INTRAMUSCULAR | Status: AC
  Administered 2023-10-25: 4 mg via INTRAVENOUS
  Filled 2023-10-25: qty 2

## 2023-10-25 MED ORDER — ACETAMINOPHEN 650 MG RE SUPP: 650.0000 mg | Freq: Four times a day (QID) | RECTAL | Status: DC | PRN

## 2023-10-25 MED ORDER — CLEVIDIPINE BUTYRATE 0.5 MG/ML IV EMUL
0.0000 mg/h | INTRAVENOUS | Status: DC
  Administered 2023-10-25: 4 mg/h via INTRAVENOUS
  Administered 2023-10-26 – 2023-10-29 (×4): 2 mg/h via INTRAVENOUS
  Filled 2023-10-25 (×4): qty 50

## 2023-10-25 MED ORDER — VANCOMYCIN HCL 1250 MG/250ML IV SOLN
1250.0000 mg | INTRAVENOUS | Status: DC
  Administered 2023-10-26: 1250 mg via INTRAVENOUS
  Filled 2023-10-25: qty 250

## 2023-10-25 MED ORDER — LACTATED RINGERS IV BOLUS
1000.0000 mL | Freq: Once | INTRAVENOUS | Status: AC
  Administered 2023-10-25: 1000 mL via INTRAVENOUS

## 2023-10-25 NOTE — Assessment & Plan Note (Addendum)
 Fevers of 5 days duration.  Febrile to 101.2 today.   WBC 4.3.  Not tachycardic or tachypneic.  Normal lactic acid 0.9.  Denies respiratory, GI, or GU symptoms, and Chest x-ray, UA not suggestive of infectious etiology.  COVID, influenza RSV negative.   Mental status intact.   -Treatment duration of fevers, will start broad-spectrum antibiotics IV vancomycin, IV cefepime and metronidazole for now. - Follow-up blood cultures - 1 L bolus given - Add on procalcitonin - Some redness to dorsal surface of right elbow and forearm, without tenderness and, patient is able to move the joint without pain, monitor for now

## 2023-10-25 NOTE — ED Triage Notes (Signed)
 Pt c/o of fever since Friday. Pt states she woke morning with her HR high. States " I could feel it fluttering"

## 2023-10-25 NOTE — H&P (Addendum)
 History and Physical    Miranda Decker YNW:[id number] DOB: April 10, 1956 DOA: 10/25/2023  PCP: Patient, No Pcp Per   Patient coming from: Home  I have personally briefly reviewed patient's old medical records in [facility]  Chief Complaint: fevers  HPI: Miranda Decker is a 68 y.o. female with medical history significant for hypertension.  Patient presented to the ED with complaints of fevers of 5 days duration.  She reports fever up to 103.4 at home.  She reports generalized body aches. She denies difficulty breathing, no cough, no vomiting no abdominal pain no diarrhea, no urinary symptoms.  No headaches, no chest pain, no change in vision.  Patient has not taking any antihypertensives in at least 3 years.  She was started on lisinopril years ago, but developed tinnitus which has persisted hence lisinopril was discontinued.  Then during COVID she had COVID infection, she reports she was started on 4 blood pressure medications at the time, reports dizziness, almost passing out and that she was unable to function- so she stopped taking them.  She does not remember the names. Reports she checks her blood pressure at home, usually systolic is in the 150s and diastolic in the 80s.  ED Course: Temperature 101.2.  Blood pressure up to 250/91.  Lactic acid 0.9 > 1.1.  WBC 4.3.  COVID influenza RSV negative.  UA not suggestive of infection.  Chest x-ray clear. Blood cultures obtained. IV diltiazem 10 mg x 1 given for high blood pressure. 1 L bolus given. With persistently elevated blood pressure, clevidipine drip was started.  Norvasc 5 mg x 1 given. Patient got up in the ED to use the restroom, while in there- she became lightheaded like she was going to pass out so she gently slid to the floor.  She did not fall.  She did not hit her head.  She reports this is typically what happens with antihypertensives.  Blood pressure checked then was 115/64.  Review of Systems: As per HPI all other systems  reviewed and negative.  Past Medical History:  Diagnosis Date   Hypertension     History reviewed. No pertinent surgical history.   reports that she has never smoked. She does not have any smokeless tobacco history on file. She reports that she does not drink alcohol and does not use drugs.  Allergies  Allergen Reactions   Lisinopril Other (See Comments)    Passes out    Family history of hypertension.  Prior to Admission medications   Medication Sig Start Date End Date Taking? Authorizing Provider  hydrochlorothiazide  (HYDRODIURIL ) 25 MG tablet Take 1 tablet (25 mg total) by mouth daily. 08/14/18   Nicklas Barns, MD    Physical Exam: Vitals:   10/25/23 1700 10/25/23 1715 10/25/23 1725 10/25/23 1727  BP: (!) 186/69 (!) 197/81 (!) 185/72 (!) 175/77  Pulse: 87 80 88 90  Resp: 16 16 15 17   Temp:      TempSrc:      SpO2: 100% 97% 96% 98%  Weight:      Height:        Constitutional: appears flushed calm, comfortable Vitals:   10/25/23 1700 10/25/23 1715 10/25/23 1725 10/25/23 1727  BP: (!) 186/69 (!) 197/81 (!) 185/72 (!) 175/77  Pulse: 87 80 88 90  Resp: 16 16 15 17   Temp:      TempSrc:      SpO2: 100% 97% 96% 98%  Weight:      Height:  Eyes: PERRL, lids and conjunctivae normal ENMT: Mucous membranes are moist.  Neck: normal, supple, no masses, no thyromegaly Respiratory: clear to auscultation bilaterally, no wheezing, no crackles. Normal respiratory effort. No accessory muscle use.  Cardiovascular: Regular rate and rhythm, no murmurs / rubs / gallops. No extremity edema.  Abdomen: no tenderness, no masses palpated. No hepatosplenomegaly. Bowel sounds positive.  Musculoskeletal: no clubbing / cyanosis. No joint deformity upper and lower extremities.  Skin: Patient does appear flushed generally, likely from fever, mild redness to dorsal surface of right, without tenderness, she is able to move the joint without pain,-May be from blood pressure cuff or fever  also.  No rashes, lesions, ulcers.  Neurologic: No facial asymmetry, moving EXTR spontaneously, speech fluent.  Psychiatric: Normal judgment and insight. Alert and oriented x 3. Normal mood.   Labs on Admission: I have personally reviewed following labs and imaging studies  CBC: Recent Labs  Lab 10/25/23 1114  WBC 4.3  NEUTROABS 3.4  HGB 13.8  HCT 42.3  MCV 86.9  PLT 119*   Basic Metabolic Panel: Recent Labs  Lab 10/25/23 1114  NA 137  K 3.4*  CL 100  CO2 25  GLUCOSE 112*  BUN 14  CREATININE 0.81  CALCIUM 8.8*   GFR: Estimated Creatinine Clearance: 63.7 mL/min (by C-G formula based on SCr of 0.81 mg/dL). Liver Function Tests: Recent Labs  Lab 10/25/23 1114  AST 28  ALT 26  ALKPHOS 76  BILITOT 0.6  PROT 6.6  ALBUMIN 3.5   Urine analysis:    Component Value Date/Time   COLORURINE STRAW (A) 10/25/2023 1229   APPEARANCEUR CLEAR 10/25/2023 1229   LABSPEC 1.004 (L) 10/25/2023 1229   PHURINE 6.0 10/25/2023 1229   GLUCOSEU NEGATIVE 10/25/2023 1229   HGBUR NEGATIVE 10/25/2023 1229   BILIRUBINUR NEGATIVE 10/25/2023 1229   KETONESUR NEGATIVE 10/25/2023 1229   PROTEINUR NEGATIVE 10/25/2023 1229   NITRITE NEGATIVE 10/25/2023 1229   LEUKOCYTESUR NEGATIVE 10/25/2023 1229    Radiological Exams on Admission: DG Chest Portable 1 View Result Date: 10/25/2023 CLINICAL DATA:  Fever.  Hypertension. EXAM: PORTABLE CHEST 1 VIEW COMPARISON:  08/14/2018 FINDINGS: The lungs are clear without focal pneumonia, edema, pneumothorax or pleural effusion. The cardiopericardial silhouette is within normal limits for size. No acute bony abnormality. IMPRESSION: No active disease. Electronically Signed   By: Donnal Fusi M.D.   On: 10/25/2023 12:03    EKG: Independently reviewed.  Sinus rhythm, rate 80, QTc 391.  No prior EKG to compare.  Assessment/Plan Principal Problem:   Hypertensive urgency Active Problems:   Fever of unknown origin  Assessment and Plan: * Hypertensive  urgency No evidence of endorgan dysfunction at this time.  Blood pressure elevated to 250/102, started on clevidipine drip with improvement in blood pressure, down to 115/64, patient became symptomatic.  Given uncontrolled high blood pressure, likely driven by acute illness also.  Has not tolerated antibiotics since in the past.  Lisinopril caused tinnitus. -Continue clevidipine and wean down to goal blood pressure 170- 180 for now -Started on Norvasc 5 mg daily, continue -Considering poor tolerance of antihypertensives, consider slow and gradual up-titration of oral antihypertensives, and limiting number of meds as much as possible.  Fever of unknown origin Fevers of 5 days duration.  Febrile to 101.2 today.   WBC 4.3.  Not tachycardic or tachypneic.  Normal lactic acid 0.9.  Denies respiratory, GI, or GU symptoms, and Chest x-ray, UA not suggestive of infectious etiology.  COVID, influenza RSV negative.  Mental status intact.   -Treatment duration of fevers, will start broad-spectrum antibiotics IV vancomycin, IV cefepime and metronidazole for now. - Follow-up blood cultures - 1 L bolus given - Add on procalcitonin - Some redness to dorsal surface of right elbow and forearm, without tenderness and, patient is able to move the joint without pain, monitor for now  Mild hypokalemia 3.4.  Repleted.  CRITICAL CARE Performed by: Pati Bonine   Total critical care time: 70 minutes  Critical care time was exclusive of separately billable procedures and treating other patients.  Critical care was necessary to treat or prevent imminent or life-threatening deterioration.  Critical care was time spent personally by me on the following activities: development of treatment plan with patient and/or surrogate as well as nursing, discussions with consultants, evaluation of patient's response to treatment, examination of patient, obtaining history from patient or surrogate, ordering and performing  treatments and interventions, ordering and review of laboratory studies, ordering and review of radiographic studies, pulse oximetry and re-evaluation of patient's condition.   DVT prophylaxis: Lovenox Code Status: Full code. Family Communication: None at bedside. Disposition Plan: None at bedside  Consults called: None  Admission status:  Inpt Stepdown   Author: Pati Bonine, MD 10/25/2023 10:38 PM  For on call review [email or web address].

## 2023-10-25 NOTE — ED Notes (Signed)
 Pt having body aches she states.

## 2023-10-25 NOTE — Assessment & Plan Note (Addendum)
 No evidence of endorgan dysfunction at this time.  Blood pressure elevated to 250/102, started on clevidipine drip with improvement in blood pressure, down to 115/64, patient became symptomatic.  Given uncontrolled high blood pressure, likely driven by acute illness also.  Has not tolerated antibiotics since in the past.  Lisinopril caused tinnitus. -Continue clevidipine and wean down to goal blood pressure 170- 180 for now -Started on Norvasc 5 mg daily, continue -Considering poor tolerance of antihypertensives, consider slow and gradual up-titration of oral antihypertensives, and limiting number of meds as much as possible.

## 2023-10-25 NOTE — ED Provider Notes (Signed)
 [facility] AT [facility] Provider Note   CSN: [id number] Arrival date & time: 10/25/23  4098     History  Chief Complaint  Patient presents with   Hypertension   Fever    Miranda Decker is a 68 y.o. female with a history of hypertension although states that she has not been on antihypertensive medications in greater than 3 years, presenting today with a 5-day history of intermittent fever with Tmax of 103.4 occurring 2 days ago.  She endorses generalized fatigue and bodyaches but no other symptomatology although states her stools have been a little softer than normal but would not describe them as frank diarrhea.  She denies nausea or vomiting, chest pain, shortness of breath, headache, neck pain, stiffness, photophobia also denies abdominal pain and dysuria.  She does have exposure to grand children, few have been coughing but no specific diagnoses.  She has taken ibuprofen and Tylenol  for fever relief.  She has been able to maintain fluid intake, appetite for solid food has been reduced.  It was noted upon her first presentation that she had significant elevated blood pressure.  She states she used to be on lisinopril but did not tolerate this medication so stopped taking it.  Then she became significantly ill with COVID during which time her PCP put her on for blood pressure medications all at once which caused severe vertigo and falls therefore she stopped taking these medications as well.  She denies chest pain, shortness of breath, dizziness, vision changes.  She did however feel her heart fluttering this morning when she first woke which prompted her ED visit.  The history is provided by the patient.       Home Medications Prior to Admission medications   Medication Sig Start Date End Date Taking? Authorizing Provider  hydrochlorothiazide  (HYDRODIURIL ) 25 MG tablet Take 1 tablet (25 mg total) by mouth daily. 08/14/18   Zackowski, Scott, MD       Allergies    Lisinopril and Other    Review of Systems   Review of Systems  Constitutional:  Positive for fatigue and fever.  HENT:  Negative for congestion and sore throat.   Eyes: Negative.  Negative for visual disturbance.  Respiratory:  Negative for cough, chest tightness and shortness of breath.   Cardiovascular:  Positive for palpitations. Negative for chest pain and leg swelling.  Gastrointestinal:  Negative for abdominal pain, constipation, diarrhea, nausea and vomiting.  Genitourinary: Negative.  Negative for dysuria.  Musculoskeletal:  Positive for myalgias. Negative for arthralgias, joint swelling and neck pain.  Skin: Negative.  Negative for rash and wound.  Neurological:  Negative for dizziness, weakness, light-headedness, numbness and headaches.  Psychiatric/Behavioral: Negative.      Physical Exam Updated Vital Signs BP (!) 175/77   Pulse 90   Temp 99.3 F (37.4 C) (Oral)   Resp 17   Ht 5\' 2"  (1.575 m)   Wt 72.6 kg   SpO2 98%   BMI 29.26 kg/m  Physical Exam Vitals and nursing note reviewed.  Constitutional:      Appearance: She is well-developed.  HENT:     Head: Normocephalic and atraumatic.     Mouth/Throat:     Mouth: Mucous membranes are moist.     Pharynx: Oropharynx is clear. No oropharyngeal exudate or posterior oropharyngeal erythema.  Eyes:     Conjunctiva/sclera: Conjunctivae normal.  Cardiovascular:     Rate and Rhythm: Normal rate and regular rhythm.  Heart sounds: Normal heart sounds.     Comments: Pt no longer has palpitation sx at time of exam. Pulmonary:     Effort: Pulmonary effort is normal.     Breath sounds: Normal breath sounds. No wheezing.  Abdominal:     General: Bowel sounds are normal.     Palpations: Abdomen is soft.     Tenderness: There is no abdominal tenderness. There is no guarding.  Musculoskeletal:        General: Normal range of motion.     Cervical back: Normal range of motion.  Skin:    General: Skin is  warm and dry.  Neurological:     General: No focal deficit present.     Mental Status: She is alert.     ED Results / Procedures / Treatments   Labs (all labs ordered are listed, but only abnormal results are displayed) Labs Reviewed  CBC WITH DIFFERENTIAL/PLATELET - Abnormal; Notable for the following components:      Result Value   Platelets 119 (*)    Lymphs Abs 0.6 (*)    All other components within normal limits  COMPREHENSIVE METABOLIC PANEL WITH GFR - Abnormal; Notable for the following components:   Potassium 3.4 (*)    Glucose, Bld 112 (*)    Calcium 8.8 (*)    All other components within normal limits  URINALYSIS, ROUTINE W REFLEX MICROSCOPIC - Abnormal; Notable for the following components:   Color, Urine STRAW (*)    Specific Gravity, Urine 1.004 (*)    All other components within normal limits  RESP PANEL BY RT-PCR (RSV, FLU A&B, COVID)  [id number]  CULTURE, BLOOD (ROUTINE X 2)  CULTURE, BLOOD (ROUTINE X 2)  LACTIC ACID, PLASMA  LACTIC ACID, PLASMA    EKG None ED ECG REPORT   Date: 10/25/2023  Rate: 80  Rhythm: normal sinus rhythm  QRS Axis: normal  Intervals: normal  ST/T Wave abnormalities: nonspecific T wave changes  Conduction Disutrbances:none  Narrative Interpretation:   Old EKG Reviewed: none available  I have personally reviewed the EKG tracing and agree with the computerized printout as noted.  Radiology DG Chest Portable 1 View Result Date: 10/25/2023 CLINICAL DATA:  Fever.  Hypertension. EXAM: PORTABLE CHEST 1 VIEW COMPARISON:  08/14/2018 FINDINGS: The lungs are clear without focal pneumonia, edema, pneumothorax or pleural effusion. The cardiopericardial silhouette is within normal limits for size. No acute bony abnormality. IMPRESSION: No active disease. Electronically Signed   By: Donnal Fusi M.D.   On: 10/25/2023 12:03    Procedures Procedures    Medications Ordered in ED Medications  clevidipine (CLEVIPREX) infusion 0.5 mg/mL (2  mg/hr Intravenous New Bag/Given 10/25/23 1647)  lactated ringers bolus 1,000 mL (0 mLs Intravenous Stopped 10/25/23 1247)  diltiazem (CARDIZEM) injection 10 mg (10 mg Intravenous Given 10/25/23 1323)  amLODipine (NORVASC) tablet 5 mg (5 mg Oral Given 10/25/23 1525)    ED Course/ Medical Decision Making/ A&P                                 Medical Decision Making Patient presenting with complaint of fever with no significant other symptomatology although softer than normal stools raising possibility of a viral GI bug.  Her exam is relatively benign with the exception of a significant elevated blood pressure.  She has no sign of endorgan damage on today's ED workup.  She was given an IV dose of hydralazine  which briefly reduced her blood pressure to 204/79, but promptly jumped back to 250/102.  She was given a dose of oral amlodipine as well which had no effect.  Patient was then started on Cleviprex drip and is agreeable to admission secondary to hypertensive urgency.  Source of fever is unknown at this time, she has a normal range lactic acid, her respiratory panel is negative, UA is negative for infection she has a normal low WBC count at 4.3 and her c-Met is unremarkable.  Chest x-ray is clear with no sign of pneumonia.  Amount and/or Complexity of Data Reviewed Labs: ordered. Radiology: ordered. ECG/medicine tests: ordered. Discussion of management or test interpretation with external provider(s): Patient to be admitted for hypertensive urgency.  Fever of unclear etiology but suspect viral process.  Discussed with Dr. Quintella Buck who accepts patient for admission.  Risk Decision regarding hospitalization.           Final Clinical Impression(s) / ED Diagnoses Final diagnoses:  Hypertensive urgency  Febrile illness    Rx / DC Orders ED Discharge Orders     None         Emilya Justen, PA-C 10/25/23 1826    Mordecai Applebaum, MD 10/26/23 [phone]

## 2023-10-26 ENCOUNTER — Inpatient Hospital Stay (HOSPITAL_COMMUNITY)

## 2023-10-26 DIAGNOSIS — I160 Hypertensive urgency: Secondary | ICD-10-CM | POA: Diagnosis not present

## 2023-10-26 DIAGNOSIS — R509 Fever, unspecified: Secondary | ICD-10-CM | POA: Diagnosis not present

## 2023-10-26 LAB — RESPIRATORY PANEL BY PCR

## 2023-10-26 LAB — BASIC METABOLIC PANEL WITH GFR
Anion gap: 6 (ref 5–15)
BUN: 12 mg/dL (ref 8–23)
CO2: 25 mmol/L (ref 22–32)
Calcium: 8.2 mg/dL — ABNORMAL LOW (ref 8.9–10.3)
Chloride: 106 mmol/L (ref 98–111)
Creatinine, Ser: 0.74 mg/dL (ref 0.44–1.00)
GFR, Estimated: >60 mL/min (ref >60)
Glucose, Bld: 122 mg/dL — ABNORMAL HIGH (ref 70–99)
Potassium: 4 mmol/L (ref 3.5–5.1)
Sodium: 137 mmol/L (ref 135–145)

## 2023-10-26 LAB — BLOOD CULTURE ID PANEL (REFLEXED) - BCID2

## 2023-10-26 LAB — T4, FREE: Free T4: 1.26 ng/dL — ABNORMAL HIGH (ref 0.61–1.12)

## 2023-10-26 LAB — TSH: TSH: 0.375 u[IU]/mL (ref 0.350–4.500)

## 2023-10-26 LAB — CBC
HCT: 39.1 % (ref 36.0–46.0)
Hemoglobin: 13 g/dL (ref 12.0–15.0)
MCH: 29.3 pg (ref 26.0–34.0)
MCHC: 33.2 g/dL (ref 30.0–36.0)
MCV: 88.1 fL (ref 80.0–100.0)
Platelets: 112 10*3/uL — ABNORMAL LOW (ref 150–400)
RBC: 4.44 MIL/uL (ref 3.87–5.11)
RDW: 13.2 % (ref 11.5–15.5)
WBC: 3.9 10*3/uL — ABNORMAL LOW (ref 4.0–10.5)
nRBC: 0 % (ref 0.0–0.2)

## 2023-10-26 LAB — PROTIME-INR
INR: 1 (ref 0.8–1.2)
Prothrombin Time: 13.6 s (ref 11.4–15.2)

## 2023-10-26 LAB — VITAMIN B12: Vitamin B-12: 210 pg/mL (ref 180–914)

## 2023-10-26 LAB — FOLATE: Folate: 10.2 ng/mL (ref >5.9)

## 2023-10-26 LAB — MRSA NEXT GEN BY PCR, NASAL: MRSA by PCR Next Gen: NOT DETECTED

## 2023-10-26 LAB — HIV ANTIBODY (ROUTINE TESTING W REFLEX): HIV Screen 4th Generation wRfx: NONREACTIVE

## 2023-10-26 MED ORDER — CYANOCOBALAMIN 1000 MCG/ML IJ SOLN
1000.0000 ug | Freq: Once | INTRAMUSCULAR | Status: AC
  Administered 2023-10-26: 1000 ug via INTRAMUSCULAR
  Filled 2023-10-26: qty 1

## 2023-10-26 MED ORDER — DIPHENHYDRAMINE HCL 25 MG PO CAPS
25.0000 mg | ORAL_CAPSULE | Freq: Once | ORAL | Status: AC
  Administered 2023-10-26: 25 mg via ORAL
  Filled 2023-10-26: qty 1

## 2023-10-26 MED ORDER — HYDRALAZINE HCL 10 MG PO TABS
10.0000 mg | ORAL_TABLET | Freq: Three times a day (TID) | ORAL | Status: DC
  Administered 2023-10-26: 10 mg via ORAL
  Filled 2023-10-26: qty 1

## 2023-10-26 MED ORDER — IOHEXOL 9 MG/ML PO SOLN
ORAL | Status: AC
  Filled 2023-10-26: qty 1000

## 2023-10-26 MED ORDER — IOHEXOL 300 MG/ML  SOLN
100.0000 mL | Freq: Once | INTRAMUSCULAR | Status: AC | PRN
  Administered 2023-10-26: 100 mL via INTRAVENOUS

## 2023-10-26 MED ORDER — VITAMIN B-12 100 MCG PO TABS
500.0000 ug | ORAL_TABLET | Freq: Every day | ORAL | Status: DC
  Administered 2023-10-27 – 2023-10-30 (×4): 500 ug via ORAL
  Filled 2023-10-26 (×4): qty 5

## 2023-10-26 MED ORDER — HYDRALAZINE HCL 25 MG PO TABS
25.0000 mg | ORAL_TABLET | Freq: Three times a day (TID) | ORAL | Status: DC
  Administered 2023-10-26 – 2023-10-27 (×3): 25 mg via ORAL
  Filled 2023-10-26 (×3): qty 1

## 2023-10-26 NOTE — TOC CM/SW Note (Signed)
 Transition of Care [facility]) - Inpatient Brief Assessment   Patient Details  Name: Miranda Decker MRN: [id number] Date of Birth: 05/10/1956  Transition of Care [facility]) CM/SW Contact:    Grandville Lax, LCSWA Phone Number: 10/26/2023, 10:07 AM   Clinical Narrative: CSW notes no PCP per chart review. CSW added PCP list to pts AVS for review at D/C.   Transition of Care Department [facility]) has reviewed patient and no TOC needs have been identified at this time. We will continue to monitor patient advancement through interdiciplinary progression rounds. If new patient transition needs arise, please place a TOC consult.  Transition of Care Asessment: Insurance and Status: Insurance coverage has been reviewed Patient has primary care physician: No (PCP list added to AVS) Home environment has been reviewed: From home Prior level of function:: Independent Prior/Current Home Services: No current home services Social Drivers of Health Review: SDOH reviewed no interventions necessary Readmission risk has been reviewed: Yes Transition of care needs: no transition of care needs at this time

## 2023-10-26 NOTE — Plan of Care (Signed)

## 2023-10-26 NOTE — Progress Notes (Signed)
 Pharmacy Antibiotic Note  Miranda Decker is a 68 y.o. female admitted on 10/25/2023 with fevers of unknown origin.  Pharmacy has been consulted for vancomycin and cefepime dosing.  Tmax 101.2, wbc normal. Renal function normal.   Vancomycin 1500 mg IV Q 24 hrs. Goal AUC 400-550. Expected AUC: 440 SCr used: 0.8  Cefepime 2g q8 hours  Height: 5\' 2"  (157.5 cm) Weight: 76.1 kg (167 lb 12.3 oz) IBW/kg (Calculated) : 50.1  Temp (24hrs), Avg:98.8 F (37.1 C), Min:97.8 F (36.6 C), Max:101.2 F (38.4 C)  Recent Labs  Lab 10/25/23 1114 10/25/23 1220 10/26/23 0455  WBC 4.3  --  3.9*  CREATININE 0.81  --  0.74  LATICACIDVEN 0.9 1.1  --     Estimated Creatinine Clearance: 66.1 mL/min (by C-G formula based on SCr of 0.74 mg/dL).    Allergies  Allergen Reactions   Lisinopril Other (See Comments)    Passes out    Other Other (See Comments)    4 other bp medications Vertigo, off balance, dizziness, disoriented    Thank you for allowing pharmacy to be a part of this patient's care.  Audra Blend PharmD., BCPS Clinical Pharmacist 10/26/2023 11:13 AM

## 2023-10-26 NOTE — Discharge Instructions (Signed)

## 2023-10-26 NOTE — Hospital Course (Addendum)
 68 year old female with a history of hypertension and hyperlipidemia presenting with 5-day history of fevers up to 103.4 F at home.  The patient denies any recent travels.  She denies any sick contacts.  She has had some myalgias and arthralgias.  She has 1 dog but denies any exotic pets.  She denies any recent hiking, camping, or outdoor activities.  She denies any headache, chest pain, coughing, hemoptysis, nausea, vomiting, diarrhea, abdominal pain, dysuria. She has had some nasal congestion which she is attributing to "allergies". In the ED, the patient was febrile to 101.2 F.  She was hemodynamically stable with oxygen saturation 99% room air.  WBC 4.3, hemoglobin 13.8, platelets 119.  Sodium 137, potassium 3.4, bicarbonate 25, serum creatinine 0.81.  LFTs unremarkable.  UA was negative for pyuria.  Blood cultures were obtained, and the patient was started on vancomycin  and cefepime . She was noted to have a blood pressure of 250/91.  She was started on Cleviprex .  Patient's global improvement was slowed secondary to her persistent fevers and hypertension.  Oral antihypertensive medications were gradually titrated.  She required the Cleviprex  drip intermittently during the hospitalization.  Gradually, the patient's fevers trended down and improved.  Cleviprex  drip was ultimately off.

## 2023-10-26 NOTE — Plan of Care (Signed)
   Problem: Education: Goal: Knowledge of General Education information will improve Description Including pain rating scale, medication(s)/side effects and non-pharmacologic comfort measures Outcome: Progressing

## 2023-10-26 NOTE — Progress Notes (Signed)
 Date and time results received: 10/26/23 1150 (use smartphrase ".now" to insert current time)  Test: Blood Culture  Critical Value: 1st bottle, 1st set: Aerobic Bottle, gram positive cocci  Name of Provider Notified: Dr Winferd Hatter and Caretha Chapel, patient RN  Orders Received? Or Actions Taken?: No new orders currently.

## 2023-10-26 NOTE — Progress Notes (Signed)
 PROGRESS NOTE  Miranda Decker YQM:[id number] DOB: 10-04-1955 DOA: 10/25/2023 PCP: Patient, No Pcp Per  Brief History:  68 year old female with a history of hypertension and hyperlipidemia presenting with 5-day history of fevers up to 103.4 F at home.  The patient denies any recent travels.  She denies any sick contacts.  She has had some myalgias and arthralgias.  She has 1 dog but denies any exotic pets.  She denies any recent hiking, camping, or outdoor activities.  She denies any headache, chest pain, coughing, hemoptysis, nausea, vomiting, diarrhea, abdominal pain, dysuria. She has had some nasal congestion which she is attributing to "allergies". In the ED, the patient was febrile to 101.2 F.  She was hemodynamically stable with oxygen saturation 99% room air.  WBC 4.3, hemoglobin 13.8, platelets 119.  Sodium 137, potassium 3.4, bicarbonate 25, serum creatinine 0.81.  LFTs unremarkable.  UA was negative for pyuria.  Blood cultures were obtained, and the patient was started on vancomycin and cefepime. She was noted to have a blood pressure of 250 50/91.  She was started on Cleviprex.   Assessment/Plan: Fever of unknown origin - UA negative for pyuria - Follow blood cultures - Chest x-ray negative for infiltrates - Obtain CT chest, abdomen, pelvis - COVID-19 PCR negative - Check viral respiratory panel - PCT 0.28  Essential hypertension - Wean off Cleviprex -continue amlodipine -aim for SBP 170s to 180s today  Thrombocytopenia - B12 - Folic acid - Likely secondary to acute medical illness - Check INR  Obesity -BMI 30.69 -lifestyle modification         Family Communication:  no Family at bedside  Consultants:  none  Code Status:  FULL   DVT Prophylaxis:   [place] Lovenox   Procedures: As Listed in Progress Note Above  Antibiotics: Vanc 5/28>> Cefepime 5/28>>      Subjective: Patient denies fevers, chills, headache, chest pain, dyspnea, nausea,  vomiting, diarrhea, abdominal pain, dysuria, hematuria, hematochezia, and melena.   Objective: Vitals:   10/26/23 0630 10/26/23 0700 10/26/23 0730 10/26/23 0757  BP: (!) 183/64 (!) 176/63 (!) 189/57   Pulse: 74 74 75 88  Resp: 13 16 15 17   Temp:    99.6 F (37.6 C)  TempSrc:    Oral  SpO2: 100% 99%  98%  Weight:      Height:        Intake/Output Summary (Last 24 hours) at 10/26/2023 5284 Last data filed at 10/26/2023 0757 Gross per 24 hour  Intake 786.12 ml  Output 700 ml  Net 86.12 ml   Weight change:  Exam:  General:  Pt is alert, follows commands appropriately, not in acute distress HEENT: No icterus, No thrush, No neck mass, [place]/AT Cardiovascular: RRR, S1/S2, no rubs, no gallops Respiratory: CTA bilaterally, no wheezing, no crackles, no rhonchi Abdomen: Soft/+BS, non tender, non distended, no guarding Extremities: No edema, No lymphangitis, No petechiae, No rashes, no synovitis;  nonblanchable erythema right elbow--nontender   Data Reviewed: I have personally reviewed following labs and imaging studies Basic Metabolic Panel: Recent Labs  Lab 10/25/23 1114 10/26/23 0455  NA 137 137  K 3.4* 4.0  CL 100 106  CO2 25 25  GLUCOSE 112* 122*  BUN 14 12  CREATININE 0.81 0.74  CALCIUM 8.8* 8.2*   Liver Function Tests: Recent Labs  Lab 10/25/23 1114  AST 28  ALT 26  ALKPHOS 76  BILITOT 0.6  PROT 6.6  ALBUMIN 3.5  No results for input(s): "LIPASE", "AMYLASE" in the last 168 hours. No results for input(s): "AMMONIA" in the last 168 hours. Coagulation Profile: No results for input(s): "INR", "PROTIME" in the last 168 hours. CBC: Recent Labs  Lab 10/25/23 1114 10/26/23 0455  WBC 4.3 3.9*  NEUTROABS 3.4  --   HGB 13.8 13.0  HCT 42.3 39.1  MCV 86.9 88.1  PLT 119* 112*   Cardiac Enzymes: No results for input(s): "CKTOTAL", "CKMB", "CKMBINDEX", "TROPONINI" in the last 168 hours. BNP: Invalid input(s): "POCBNP" CBG: No results for input(s): "GLUCAP" in  the last 168 hours. HbA1C: No results for input(s): "HGBA1C" in the last 72 hours. Urine analysis:    Component Value Date/Time   COLORURINE STRAW (A) 10/25/2023 1229   APPEARANCEUR CLEAR 10/25/2023 1229   LABSPEC 1.004 (L) 10/25/2023 1229   PHURINE 6.0 10/25/2023 1229   GLUCOSEU NEGATIVE 10/25/2023 1229   HGBUR NEGATIVE 10/25/2023 1229   BILIRUBINUR NEGATIVE 10/25/2023 1229   KETONESUR NEGATIVE 10/25/2023 1229   PROTEINUR NEGATIVE 10/25/2023 1229   NITRITE NEGATIVE 10/25/2023 1229   LEUKOCYTESUR NEGATIVE 10/25/2023 1229   Sepsis Labs: @LABRCNTIP (procalcitonin:4,lacticidven:4) ) Recent Results (from the past 240 hours)  Blood culture (routine x 2)     Status: None (Preliminary result)   Collection Time: 10/25/23 11:14 AM   Specimen: BLOOD  Result Value Ref Range Status   Specimen Description BLOOD LEFT ANTECUBITAL  Final   Special Requests   Final    BOTTLES DRAWN AEROBIC AND ANAEROBIC Blood Culture adequate volume   Culture   Final    NO GROWTH < 24 HOURS Performed at [facility], [address], [place], [place] [address]    Report Status PENDING  Incomplete  Resp panel by RT-PCR (RSV, Flu A&B, Covid) Anterior Nasal Swab     Status: None   Collection Time: 10/25/23 12:20 PM   Specimen: Anterior Nasal Swab  Result Value Ref Range Status   SARS Coronavirus 2 by RT PCR NEGATIVE NEGATIVE Final    Comment: (NOTE) SARS-CoV-2 target nucleic acids are NOT DETECTED.  The SARS-CoV-2 RNA is generally detectable in upper respiratory specimens during the acute phase of infection. The lowest concentration of SARS-CoV-2 viral copies this assay can detect is 138 copies/mL. A negative result does not preclude SARS-Cov-2 infection and should not be used as the sole basis for treatment or other patient management decisions. A negative result may occur with  improper specimen collection/handling, submission of specimen other than nasopharyngeal swab, presence of viral mutation(s)  within the areas targeted by this assay, and inadequate number of viral copies(<138 copies/mL). A negative result must be combined with clinical observations, patient history, and epidemiological information. The expected result is Negative.  Fact Sheet for Patients:  BloggerCourse.com  Fact Sheet for Healthcare Providers:  SeriousBroker.it  This test is no t yet approved or cleared by the [place]  FDA and  has been authorized for detection and/or diagnosis of SARS-CoV-2 by FDA under an Emergency Use Authorization (EUA). This EUA will remain  in effect (meaning this test can be used) for the duration of the COVID-19 declaration under Section 564(b)(1) of the Act, 21 U.S.C.section 360bbb-3(b)(1), unless the authorization is terminated  or revoked sooner.       Influenza A by PCR NEGATIVE NEGATIVE Final   Influenza B by PCR NEGATIVE NEGATIVE Final    Comment: (NOTE) The Xpert Xpress SARS-CoV-2/FLU/RSV plus assay is intended as an aid in the diagnosis of influenza from Nasopharyngeal swab specimens and should  not be used as a sole basis for treatment. Nasal washings and aspirates are unacceptable for Xpert Xpress SARS-CoV-2/FLU/RSV testing.  Fact Sheet for Patients: BloggerCourse.com  Fact Sheet for Healthcare Providers: SeriousBroker.it  This test is not yet approved or cleared by the [place]  FDA and has been authorized for detection and/or diagnosis of SARS-CoV-2 by FDA under an Emergency Use Authorization (EUA). This EUA will remain in effect (meaning this test can be used) for the duration of the COVID-19 declaration under Section 564(b)(1) of the Act, 21 U.S.C. section 360bbb-3(b)(1), unless the authorization is terminated or revoked.     Resp Syncytial Virus by PCR NEGATIVE NEGATIVE Final    Comment: (NOTE) Fact Sheet for  Patients: BloggerCourse.com  Fact Sheet for Healthcare Providers: SeriousBroker.it  This test is not yet approved or cleared by the [place]  FDA and has been authorized for detection and/or diagnosis of SARS-CoV-2 by FDA under an Emergency Use Authorization (EUA). This EUA will remain in effect (meaning this test can be used) for the duration of the COVID-19 declaration under Section 564(b)(1) of the Act, 21 U.S.C. section 360bbb-3(b)(1), unless the authorization is terminated or revoked.  Performed at [facility], [address], [place], [place] [address]   Blood culture (routine x 2)     Status: None (Preliminary result)   Collection Time: 10/25/23 12:20 PM   Specimen: BLOOD  Result Value Ref Range Status   Specimen Description BLOOD BLOOD RIGHT ARM AEROBIC BOTTLE ONLY  Final   Special Requests   Final    BOTTLES DRAWN AEROBIC ONLY Blood Culture results may not be optimal due to an inadequate volume of blood received in culture bottles   Culture   Final    NO GROWTH < 24 HOURS Performed at [facility], [address], [place], [place] [address]    Report Status PENDING  Incomplete  MRSA Next Gen by PCR, Nasal     Status: None   Collection Time: 10/25/23  7:15 PM   Specimen: Nasal Mucosa; Nasal Swab  Result Value Ref Range Status   MRSA by PCR Next Gen NOT DETECTED NOT DETECTED Final    Comment: (NOTE) The GeneXpert MRSA Assay (FDA approved for NASAL specimens only), is one component of a comprehensive MRSA colonization surveillance program. It is not intended to diagnose MRSA infection nor to guide or monitor treatment for MRSA infections. Test performance is not FDA approved in patients less than 70 years old. Performed at [facility], [address] [place], [place] [address]      Scheduled Meds:  amLODipine  5 mg Oral Daily   Chlorhexidine Gluconate Cloth  6 each Topical Q0600   enoxaparin (LOVENOX) injection   40 mg Subcutaneous Q24H   Continuous Infusions:  ceFEPime (MAXIPIME) IV 2 g (10/26/23 0549)   clevidipine Stopped (10/25/23 2305)   metronidazole Stopped (10/25/23 2249)   vancomycin      Procedures/Studies: DG Chest Portable 1 View Result Date: 10/25/2023 CLINICAL DATA:  Fever.  Hypertension. EXAM: PORTABLE CHEST 1 VIEW COMPARISON:  08/14/2018 FINDINGS: The lungs are clear without focal pneumonia, edema, pneumothorax or pleural effusion. The cardiopericardial silhouette is within normal limits for size. No acute bony abnormality. IMPRESSION: No active disease. Electronically Signed   By: Donnal Fusi M.D.   On: 10/25/2023 12:03    Demaris Fillers, DO  [facility]  If 7PM-7AM, please contact night-coverage [email or web address] Password [email or web address] 10/26/2023, 8:32 AM   LOS: 1 day

## 2023-10-27 DIAGNOSIS — R7881 Bacteremia: Secondary | ICD-10-CM | POA: Diagnosis not present

## 2023-10-27 DIAGNOSIS — R509 Fever, unspecified: Secondary | ICD-10-CM | POA: Diagnosis not present

## 2023-10-27 DIAGNOSIS — I160 Hypertensive urgency: Secondary | ICD-10-CM | POA: Diagnosis not present

## 2023-10-27 MED ORDER — DIPHENHYDRAMINE HCL 25 MG PO CAPS
25.0000 mg | ORAL_CAPSULE | Freq: Once | ORAL | Status: AC
  Administered 2023-10-27: 25 mg via ORAL
  Filled 2023-10-27: qty 1

## 2023-10-27 MED ORDER — HYDRALAZINE HCL 50 MG PO TABS
50.0000 mg | ORAL_TABLET | Freq: Three times a day (TID) | ORAL | Status: DC
  Administered 2023-10-27 – 2023-10-28 (×2): 50 mg via ORAL
  Filled 2023-10-27 (×2): qty 1

## 2023-10-27 MED ORDER — LOSARTAN POTASSIUM 50 MG PO TABS
50.0000 mg | ORAL_TABLET | Freq: Every day | ORAL | Status: DC
  Administered 2023-10-28 – 2023-10-30 (×3): 50 mg via ORAL
  Filled 2023-10-27 (×3): qty 1

## 2023-10-27 MED ORDER — AMLODIPINE BESYLATE 5 MG PO TABS
10.0000 mg | ORAL_TABLET | Freq: Every day | ORAL | Status: DC
  Administered 2023-10-28 – 2023-10-30 (×3): 10 mg via ORAL
  Filled 2023-10-27 (×3): qty 2

## 2023-10-27 MED ORDER — HYDRALAZINE HCL 25 MG PO TABS
25.0000 mg | ORAL_TABLET | Freq: Once | ORAL | Status: AC
  Administered 2023-10-27: 25 mg via ORAL
  Filled 2023-10-27: qty 1

## 2023-10-27 MED ORDER — LOSARTAN POTASSIUM 25 MG PO TABS
25.0000 mg | ORAL_TABLET | Freq: Every day | ORAL | Status: DC
  Administered 2023-10-27: 25 mg via ORAL
  Filled 2023-10-27: qty 1

## 2023-10-27 NOTE — Progress Notes (Signed)
 PROGRESS NOTE  Miranda Decker YNW:[id number] DOB: [birth year] DOA: 10/25/2023 PCP: Patient, No Pcp Per  Brief History:  68 year old female with a history of hypertension and hyperlipidemia presenting with 5-day history of fevers up to 103.4 F at home.  The patient denies any recent travels.  She denies any sick contacts.  She has had some myalgias and arthralgias.  She has 1 dog but denies any exotic pets.  She denies any recent hiking, camping, or outdoor activities.  She denies any headache, chest pain, coughing, hemoptysis, nausea, vomiting, diarrhea, abdominal pain, dysuria. She has had some nasal congestion which she is attributing to "allergies". In the ED, the patient was febrile to 101.2 F.  She was hemodynamically stable with oxygen saturation 99% room air.  WBC 4.3, hemoglobin 13.8, platelets 119.  Sodium 137, potassium 3.4, bicarbonate 25, serum creatinine 0.81.  LFTs unremarkable.  UA was negative for pyuria.  Blood cultures were obtained, and the patient was started on vancomycin and cefepime. She was noted to have a blood pressure of 250 50/91.  She was started on Cleviprex.   Assessment/Plan: Fever of unknown origin - UA negative for pyuria - Follow blood cultures--CoNS one of two sets = contaminant - Chest x-ray negative for infiltrates - Obtain CT chest, abdomen, pelvis--no acute findings - COVID-19 PCR negative - Check viral respiratory panel +Parainfluenza  - PCT 0.28 - d/c vanc and cefepime   Essential hypertension - still required cleviprex last night 5/29 -continue amlodipine -aim for SBP 170s to 180s today -increase hydralazine po dose -add losartan   Thrombocytopenia - B12--210>>replete - Folic acid--10.2 - Likely secondary to acute medical illness - Check INR 1.0   Obesity -BMI 30.69 -lifestyle modification                 Family Communication:  no Family at bedside   Consultants:  none   Code Status:  FULL    DVT Prophylaxis:   [place]  Lovenox     Procedures: As Listed in Progress Note Above   Antibiotics: Vanc 5/28>>5/30 Cefepime 5/28>>5/30          Subjective: Patient denies fevers, chills, headache, chest pain, dyspnea, nausea, vomiting, diarrhea, abdominal pain, dysuria, hematuria, hematochezia, and melena.   Objective: Vitals:   10/27/23 1400 10/27/23 1430 10/27/23 1500 10/27/23 1600  BP: (!) 197/75 (!) 193/63 (!) 207/65 (!) 205/63  Pulse: 77 82 90 89  Resp: 13 15 16 18   Temp:      TempSrc:      SpO2: 99% 100% 100% 99%  Weight:      Height:        Intake/Output Summary (Last 24 hours) at 10/27/2023 1625 Last data filed at 10/27/2023 1327 Gross per 24 hour  Intake 1025.16 ml  Output 800 ml  Net 225.16 ml   Weight change:  Exam:  General:  Pt is alert, follows commands appropriately, not in acute distress HEENT: No icterus, No thrush, No neck mass, [place]/AT Cardiovascular: RRR, S1/S2, no rubs, no gallops Respiratory: bibasilar rales. No wheeze Abdomen: Soft/+BS, non tender, non distended, no guarding Extremities: No edema, No lymphangitis, No petechiae, No rashes, no synovitis   Data Reviewed: I have personally reviewed following labs and imaging studies Basic Metabolic Panel: Recent Labs  Lab 10/25/23 1114 10/26/23 0455  NA 137 137  K 3.4* 4.0  CL 100 106  CO2 25 25  GLUCOSE 112* 122*  BUN 14 12  CREATININE  0.81 0.74  CALCIUM 8.8* 8.2*   Liver Function Tests: Recent Labs  Lab 10/25/23 1114  AST 28  ALT 26  ALKPHOS 76  BILITOT 0.6  PROT 6.6  ALBUMIN 3.5   No results for input(s): "LIPASE", "AMYLASE" in the last 168 hours. No results for input(s): "AMMONIA" in the last 168 hours. Coagulation Profile: Recent Labs  Lab 10/26/23 0852  INR 1.0   CBC: Recent Labs  Lab 10/25/23 1114 10/26/23 0455  WBC 4.3 3.9*  NEUTROABS 3.4  --   HGB 13.8 13.0  HCT 42.3 39.1  MCV 86.9 88.1  PLT 119* 112*   Cardiac Enzymes: No results for input(s): "CKTOTAL", "CKMB",  "CKMBINDEX", "TROPONINI" in the last 168 hours. BNP: Invalid input(s): "POCBNP" CBG: No results for input(s): "GLUCAP" in the last 168 hours. HbA1C: No results for input(s): "HGBA1C" in the last 72 hours. Urine analysis:    Component Value Date/Time   COLORURINE STRAW (A) 10/25/2023 1229   APPEARANCEUR CLEAR 10/25/2023 1229   LABSPEC 1.004 (L) 10/25/2023 1229   PHURINE 6.0 10/25/2023 1229   GLUCOSEU NEGATIVE 10/25/2023 1229   HGBUR NEGATIVE 10/25/2023 1229   BILIRUBINUR NEGATIVE 10/25/2023 1229   KETONESUR NEGATIVE 10/25/2023 1229   PROTEINUR NEGATIVE 10/25/2023 1229   NITRITE NEGATIVE 10/25/2023 1229   LEUKOCYTESUR NEGATIVE 10/25/2023 1229   Sepsis Labs: @LABRCNTIP (procalcitonin:4,lacticidven:4) ) Recent Results (from the past 240 hours)  Blood culture (routine x 2)     Status: None (Preliminary result)   Collection Time: 10/25/23 11:14 AM   Specimen: BLOOD  Result Value Ref Range Status   Specimen Description BLOOD LEFT ANTECUBITAL  Final   Special Requests   Final    BOTTLES DRAWN AEROBIC AND ANAEROBIC Blood Culture adequate volume   Culture   Final    NO GROWTH < 24 HOURS Performed at [facility], [address], [place], [place] [address]    Report Status PENDING  Incomplete  Resp panel by RT-PCR (RSV, Flu A&B, Covid) Anterior Nasal Swab     Status: None   Collection Time: 10/25/23 12:20 PM   Specimen: Anterior Nasal Swab  Result Value Ref Range Status   SARS Coronavirus 2 by RT PCR NEGATIVE NEGATIVE Final    Comment: (NOTE) SARS-CoV-2 target nucleic acids are NOT DETECTED.  The SARS-CoV-2 RNA is generally detectable in upper respiratory specimens during the acute phase of infection. The lowest concentration of SARS-CoV-2 viral copies this assay can detect is 138 copies/mL. A negative result does not preclude SARS-Cov-2 infection and should not be used as the sole basis for treatment or other patient management decisions. A negative result may occur with   improper specimen collection/handling, submission of specimen other than nasopharyngeal swab, presence of viral mutation(s) within the areas targeted by this assay, and inadequate number of viral copies(<138 copies/mL). A negative result must be combined with clinical observations, patient history, and epidemiological information. The expected result is Negative.  Fact Sheet for Patients:  BloggerCourse.com  Fact Sheet for Healthcare Providers:  SeriousBroker.it  This test is no t yet approved or cleared by the [place]  FDA and  has been authorized for detection and/or diagnosis of SARS-CoV-2 by FDA under an Emergency Use Authorization (EUA). This EUA will remain  in effect (meaning this test can be used) for the duration of the COVID-19 declaration under Section 564(b)(1) of the Act, 21 U.S.C.section 360bbb-3(b)(1), unless the authorization is terminated  or revoked sooner.       Influenza A by PCR NEGATIVE NEGATIVE  Final   Influenza B by PCR NEGATIVE NEGATIVE Final    Comment: (NOTE) The Xpert Xpress SARS-CoV-2/FLU/RSV plus assay is intended as an aid in the diagnosis of influenza from Nasopharyngeal swab specimens and should not be used as a sole basis for treatment. Nasal washings and aspirates are unacceptable for Xpert Xpress SARS-CoV-2/FLU/RSV testing.  Fact Sheet for Patients: BloggerCourse.com  Fact Sheet for Healthcare Providers: SeriousBroker.it  This test is not yet approved or cleared by the [place]  FDA and has been authorized for detection and/or diagnosis of SARS-CoV-2 by FDA under an Emergency Use Authorization (EUA). This EUA will remain in effect (meaning this test can be used) for the duration of the COVID-19 declaration under Section 564(b)(1) of the Act, 21 U.S.C. section 360bbb-3(b)(1), unless the authorization is terminated or revoked.      Resp Syncytial Virus by PCR NEGATIVE NEGATIVE Final    Comment: (NOTE) Fact Sheet for Patients: BloggerCourse.com  Fact Sheet for Healthcare Providers: SeriousBroker.it  This test is not yet approved or cleared by the [place]  FDA and has been authorized for detection and/or diagnosis of SARS-CoV-2 by FDA under an Emergency Use Authorization (EUA). This EUA will remain in effect (meaning this test can be used) for the duration of the COVID-19 declaration under Section 564(b)(1) of the Act, 21 U.S.C. section 360bbb-3(b)(1), unless the authorization is terminated or revoked.  Performed at [facility], [address] [place], [place] [address]   Blood culture (routine x 2)     Status: Abnormal (Preliminary result)   Collection Time: 10/25/23 12:20 PM   Specimen: BLOOD  Result Value Ref Range Status   Specimen Description   Final    BLOOD BLOOD RIGHT ARM Performed at [facility], [address] [place], [place] [address]    Special Requests   Final    BOTTLES DRAWN AEROBIC ONLY Blood Culture results may not be optimal due to an inadequate volume of blood received in culture bottles Performed at [facility], [address] [place], [place] [address]    Culture  Setup Time   Final    AEROBIC BOTTLE ONLY GRAM POSITIVE COCCI Gram Stain Report Called to,Read Back By and Verified With: J KING AT 1147 10/26/23 BY A WILSON CRITICAL RESULT CALLED TO, READ BACK BY AND VERIFIED WITH: RN GEORGE ROOS ON 10/26/23 @ 1725 BY DRT    Culture (A)  Final    STAPHYLOCOCCUS CAPITIS STAPHYLOCOCCUS HOMINIS THE SIGNIFICANCE OF ISOLATING THIS ORGANISM FROM A SINGLE SET OF BLOOD CULTURES WHEN MULTIPLE SETS ARE DRAWN IS UNCERTAIN. PLEASE NOTIFY THE MICROBIOLOGY DEPARTMENT WITHIN ONE WEEK IF SPECIATION AND SENSITIVITIES ARE REQUIRED. Performed at [facility], [address], [place], [place] [address]    Report Status PENDING   Incomplete  Blood Culture ID Panel (Reflexed)     Status: Abnormal   Collection Time: 10/25/23 12:20 PM  Result Value Ref Range Status   Enterococcus faecalis NOT DETECTED NOT DETECTED Final   Enterococcus Faecium NOT DETECTED NOT DETECTED Final   Listeria monocytogenes NOT DETECTED NOT DETECTED Final   Staphylococcus species DETECTED (A) NOT DETECTED Final    Comment: CRITICAL RESULT CALLED TO, READ BACK BY AND VERIFIED WITH: RN GEORGE ROOS ON 10/26/23 @ 1725 BY DRT    Staphylococcus aureus (BCID) NOT DETECTED NOT DETECTED Final   Staphylococcus epidermidis NOT DETECTED NOT DETECTED Final   Staphylococcus lugdunensis NOT DETECTED NOT DETECTED Final   Streptococcus species NOT DETECTED NOT DETECTED Final  Streptococcus agalactiae NOT DETECTED NOT DETECTED Final   Streptococcus pneumoniae NOT DETECTED NOT DETECTED Final   Streptococcus pyogenes NOT DETECTED NOT DETECTED Final   A.calcoaceticus-baumannii NOT DETECTED NOT DETECTED Final   Bacteroides fragilis NOT DETECTED NOT DETECTED Final   Enterobacterales NOT DETECTED NOT DETECTED Final   Enterobacter cloacae complex NOT DETECTED NOT DETECTED Final   Escherichia coli NOT DETECTED NOT DETECTED Final   Klebsiella aerogenes NOT DETECTED NOT DETECTED Final   Klebsiella oxytoca NOT DETECTED NOT DETECTED Final   Klebsiella pneumoniae NOT DETECTED NOT DETECTED Final   Proteus species NOT DETECTED NOT DETECTED Final   Salmonella species NOT DETECTED NOT DETECTED Final   Serratia marcescens NOT DETECTED NOT DETECTED Final   Haemophilus influenzae NOT DETECTED NOT DETECTED Final   Neisseria meningitidis NOT DETECTED NOT DETECTED Final   Pseudomonas aeruginosa NOT DETECTED NOT DETECTED Final   Stenotrophomonas maltophilia NOT DETECTED NOT DETECTED Final   Candida albicans NOT DETECTED NOT DETECTED Final   Candida auris NOT DETECTED NOT DETECTED Final   Candida glabrata NOT DETECTED NOT DETECTED Final   Candida krusei NOT DETECTED NOT  DETECTED Final   Candida parapsilosis NOT DETECTED NOT DETECTED Final   Candida tropicalis NOT DETECTED NOT DETECTED Final   Cryptococcus neoformans/gattii NOT DETECTED NOT DETECTED Final    Comment: Performed at [facility], [address], [place], [place] [address]  MRSA Next Gen by PCR, Nasal     Status: None   Collection Time: 10/25/23  7:15 PM   Specimen: Nasal Mucosa; Nasal Swab  Result Value Ref Range Status   MRSA by PCR Next Gen NOT DETECTED NOT DETECTED Final    Comment: (NOTE) The GeneXpert MRSA Assay (FDA approved for NASAL specimens only), is one component of a comprehensive MRSA colonization surveillance program. It is not intended to diagnose MRSA infection nor to guide or monitor treatment for MRSA infections. Test performance is not FDA approved in patients less than 39 years old. Performed at [facility], [address] [place], [place] [address]   Respiratory (~20 pathogens) panel by PCR     Status: Abnormal   Collection Time: 10/26/23  8:59 AM   Specimen: Nasopharyngeal Swab; Respiratory  Result Value Ref Range Status   Adenovirus NOT DETECTED NOT DETECTED Final   Coronavirus 229E NOT DETECTED NOT DETECTED Final    Comment: (NOTE) The Coronavirus on the Respiratory Panel, DOES NOT test for the novel  Coronavirus (2019 nCoV)    Coronavirus HKU1 NOT DETECTED NOT DETECTED Final   Coronavirus NL63 NOT DETECTED NOT DETECTED Final   Coronavirus OC43 NOT DETECTED NOT DETECTED Final   Metapneumovirus NOT DETECTED NOT DETECTED Final   Rhinovirus / Enterovirus NOT DETECTED NOT DETECTED Final   Influenza A NOT DETECTED NOT DETECTED Final   Influenza B NOT DETECTED NOT DETECTED Final   Parainfluenza Virus 1 NOT DETECTED NOT DETECTED Final   Parainfluenza Virus 2 NOT DETECTED NOT DETECTED Final   Parainfluenza Virus 3 DETECTED (A) NOT DETECTED Final   Parainfluenza Virus 4 NOT DETECTED NOT DETECTED Final   Respiratory Syncytial Virus NOT DETECTED NOT DETECTED  Final   Bordetella pertussis NOT DETECTED NOT DETECTED Final   Bordetella Parapertussis NOT DETECTED NOT DETECTED Final   Chlamydophila pneumoniae NOT DETECTED NOT DETECTED Final   Mycoplasma pneumoniae NOT DETECTED NOT DETECTED Final    Comment: Performed at [facility], [address] [place], [place] [address]     Scheduled Meds:  Cecily Cohen  ON 10/28/2023] amLODipine   10 mg Oral Daily   Chlorhexidine  Gluconate Cloth  6 each Topical Q0600   enoxaparin  (LOVENOX ) injection  40 mg Subcutaneous Q24H   hydrALAZINE   50 mg Oral Q8H   losartan   25 mg Oral Daily   vitamin B-12  500 mcg Oral Daily   Continuous Infusions:  ceFEPime  (MAXIPIME ) IV 2 g (10/27/23 1414)   clevidipine  Stopped (10/26/23 2037)   vancomycin  Stopped (10/26/23 2334)    Procedures/Studies: CT CHEST ABDOMEN PELVIS W CONTRAST Result Date: 10/26/2023 CLINICAL DATA:  Sepsis.  Fever of unknown origin. EXAM: CT CHEST, ABDOMEN, AND PELVIS WITH CONTRAST TECHNIQUE: Multidetector CT imaging of the chest, abdomen and pelvis was performed following the standard protocol during bolus administration of intravenous contrast. RADIATION DOSE REDUCTION: This exam was performed according to the departmental dose-optimization program which includes automated exposure control, adjustment of the mA and/or kV according to patient size and/or use of iterative reconstruction technique. CONTRAST:  OMNIPAQUE  IOHEXOL  300 MG/ML  SOLN COMPARISON:  Chest radiograph dated 10/17/2018 heart. FINDINGS: CT CHEST FINDINGS Cardiovascular: There is no cardiomegaly or pericardial effusion. The thoracic aorta is unremarkable. The origins of the great vessels of the aortic arch and the central pulmonary arteries are patent. Mediastinum/Nodes: No hilar or mediastinal adenopathy. Small hiatal hernia. The esophagus and the thyroid  gland are grossly unremarkable. No mediastinal fluid collection. Lungs/Pleura: There is a 5 mm subpleural nodule in the right lower  lobe. No focal consolidation, pleural effusion, pneumothorax. The central airways are patent. Musculoskeletal: No acute osseous pathology. CT ABDOMEN PELVIS FINDINGS No intra-abdominal free air or free fluid. Hepatobiliary: No focal liver abnormality is seen. No gallstones, gallbladder wall thickening, or biliary dilatation. Pancreas: Unremarkable. No pancreatic ductal dilatation or surrounding inflammatory changes. Spleen: Normal in size without focal abnormality. Adrenals/Urinary Tract: The adrenal glands unremarkable. The kidneys, visualized ureters, and urinary bladder appear unremarkable. Stomach/Bowel: Mild sigmoid diverticulosis. There is no bowel obstruction or active inflammation. The appendix is normal. Vascular/Lymphatic: Mild aortoiliac atherosclerotic disease. The IVC is unremarkable. No portal venous gas. There is no adenopathy. Reproductive: The uterus is grossly unremarkable. No suspicious adnexal masses. Other: None Musculoskeletal: No acute or significant osseous findings. IMPRESSION: 1. No acute intrathoracic, abdominal, or pelvic pathology. 2. Mild sigmoid diverticulosis. No bowel obstruction. Normal appendix. 3. A 5 mm subpleural nodule in the right lower lobe. No follow-up needed if patient is low-risk.This recommendation follows the consensus statement: Guidelines for Management of Incidental Pulmonary Nodules Detected on CT Images: From the Fleischner Society 2017; Radiology 2017; 284:228-243. 4.  Aortic Atherosclerosis (ICD10-I70.0). Electronically Signed   By: Angus Bark M.D.   On: 10/26/2023 17:20   DG Chest Portable 1 View Result Date: 10/25/2023 CLINICAL DATA:  Fever.  Hypertension. EXAM: PORTABLE CHEST 1 VIEW COMPARISON:  08/14/2018 FINDINGS: The lungs are clear without focal pneumonia, edema, pneumothorax or pleural effusion. The cardiopericardial silhouette is within normal limits for size. No acute bony abnormality. IMPRESSION: No active disease. Electronically Signed   By:  Donnal Fusi M.D.   On: 10/25/2023 12:03    Demaris Fillers, DO  [facility]  If 7PM-7AM, please contact night-coverage [email or web address] Password [email or web address] 10/27/2023, 4:25 PM   LOS: 2 days

## 2023-10-27 NOTE — Plan of Care (Signed)

## 2023-10-27 NOTE — Progress Notes (Signed)
 Remains hypertensive, per MD Tat avoid IV BP control for now and utilize PO and and continue to monitor.

## 2023-10-27 NOTE — Plan of Care (Signed)
   Problem: Education: Goal: Knowledge of General Education information will improve Description Including pain rating scale, medication(s)/side effects and non-pharmacologic comfort measures Outcome: Progressing

## 2023-10-27 NOTE — Care Management Important Message (Signed)
 Important Message  Patient Details  Name: Miranda Decker MRN: [id number] Date of Birth: Feb 09, 1956   Important Message Given:  N/A - LOS <3 / Initial given by admissions     Aubriauna Riner L Johniece Hornbaker 10/27/2023, 2:38 PM

## 2023-10-28 DIAGNOSIS — R509 Fever, unspecified: Secondary | ICD-10-CM | POA: Diagnosis not present

## 2023-10-28 DIAGNOSIS — R7881 Bacteremia: Secondary | ICD-10-CM | POA: Diagnosis not present

## 2023-10-28 DIAGNOSIS — I160 Hypertensive urgency: Secondary | ICD-10-CM | POA: Diagnosis not present

## 2023-10-28 LAB — CULTURE, BLOOD (ROUTINE X 2)

## 2023-10-28 LAB — URINALYSIS, W/ REFLEX TO CULTURE (INFECTION SUSPECTED)
Bacteria, UA: NONE SEEN
Bilirubin Urine: NEGATIVE
Glucose, UA: NEGATIVE mg/dL
Hgb urine dipstick: NEGATIVE
Ketones, ur: 5 mg/dL — AB
Leukocytes,Ua: NEGATIVE
Nitrite: NEGATIVE
Protein, ur: 100 mg/dL — AB
Specific Gravity, Urine: 1.019 (ref 1.005–1.030)
pH: 5 (ref 5.0–8.0)

## 2023-10-28 LAB — CBC
HCT: 36.6 % (ref 36.0–46.0)
Hemoglobin: 12.1 g/dL (ref 12.0–15.0)
MCH: 28.9 pg (ref 26.0–34.0)
MCHC: 33.1 g/dL (ref 30.0–36.0)
MCV: 87.6 fL (ref 80.0–100.0)
Platelets: 140 10*3/uL — ABNORMAL LOW (ref 150–400)
RBC: 4.18 MIL/uL (ref 3.87–5.11)
RDW: 13.5 % (ref 11.5–15.5)
WBC: 3.9 10*3/uL — ABNORMAL LOW (ref 4.0–10.5)
nRBC: 0 % (ref 0.0–0.2)

## 2023-10-28 LAB — BASIC METABOLIC PANEL WITH GFR
Anion gap: 9 (ref 5–15)
BUN: 14 mg/dL (ref 8–23)
CO2: 21 mmol/L — ABNORMAL LOW (ref 22–32)
Calcium: 8.3 mg/dL — ABNORMAL LOW (ref 8.9–10.3)
Chloride: 107 mmol/L (ref 98–111)
Creatinine, Ser: 0.71 mg/dL (ref 0.44–1.00)
GFR, Estimated: >60 mL/min (ref >60)
Glucose, Bld: 111 mg/dL — ABNORMAL HIGH (ref 70–99)
Potassium: 3.3 mmol/L — ABNORMAL LOW (ref 3.5–5.1)
Sodium: 137 mmol/L (ref 135–145)

## 2023-10-28 LAB — PROCALCITONIN: Procalcitonin: 1.06 ng/mL

## 2023-10-28 LAB — LACTIC ACID, PLASMA
Lactic Acid, Venous: 1.1 mmol/L (ref 0.5–1.9)
Lactic Acid, Venous: 0.8 mmol/L (ref 0.5–1.9)

## 2023-10-28 MED ORDER — CARVEDILOL 3.125 MG PO TABS
6.2500 mg | ORAL_TABLET | Freq: Two times a day (BID) | ORAL | Status: DC
  Administered 2023-10-28 – 2023-10-30 (×4): 6.25 mg via ORAL
  Filled 2023-10-28 (×5): qty 2

## 2023-10-28 MED ORDER — ACETAMINOPHEN 500 MG PO TABS
1000.0000 mg | ORAL_TABLET | Freq: Three times a day (TID) | ORAL | Status: DC
  Administered 2023-10-28 – 2023-10-30 (×5): 1000 mg via ORAL
  Filled 2023-10-28 (×6): qty 2

## 2023-10-28 MED ORDER — POTASSIUM CHLORIDE CRYS ER 20 MEQ PO TBCR
20.0000 meq | EXTENDED_RELEASE_TABLET | Freq: Once | ORAL | Status: AC
  Administered 2023-10-28: 20 meq via ORAL
  Filled 2023-10-28: qty 1

## 2023-10-28 MED ORDER — HYDRALAZINE HCL 50 MG PO TABS
100.0000 mg | ORAL_TABLET | Freq: Three times a day (TID) | ORAL | Status: DC
  Administered 2023-10-28 – 2023-10-30 (×6): 100 mg via ORAL
  Filled 2023-10-28 (×6): qty 2

## 2023-10-28 NOTE — Plan of Care (Signed)

## 2023-10-28 NOTE — Progress Notes (Signed)
 PROGRESS NOTE  Clatie Kessen ZOX:[id number] DOB: 1955/06/06 DOA: 10/25/2023 PCP: Patient, No Pcp Per  Brief History:  68 year old female with a history of hypertension and hyperlipidemia presenting with 5-day history of fevers up to 103.4 F at home.  The patient denies any recent travels.  She denies any sick contacts.  She has had some myalgias and arthralgias.  She has 1 dog but denies any exotic pets.  She denies any recent hiking, camping, or outdoor activities.  She denies any headache, chest pain, coughing, hemoptysis, nausea, vomiting, diarrhea, abdominal pain, dysuria. She has had some nasal congestion which she is attributing to "allergies". In the ED, the patient was febrile to 101.2 F.  She was hemodynamically stable with oxygen saturation 99% room air.  WBC 4.3, hemoglobin 13.8, platelets 119.  Sodium 137, potassium 3.4, bicarbonate 25, serum creatinine 0.81.  LFTs unremarkable.  UA was negative for pyuria.  Blood cultures were obtained, and the patient was started on vancomycin  and cefepime . She was noted to have a blood pressure of 250 50/91.  She was started on Cleviprex .   Assessment/Plan: Fever  - UA negative for pyuria - Follow blood cultures--CoNS one of two sets = contaminant - Chest x-ray negative for infiltrates - Obtain CT chest, abdomen, pelvis--no acute findings - COVID-19 PCR negative - Check viral respiratory panel +Parainfluenza 3 - PCT 0.28 - d/c vanc and cefepime   Bacteremia -represents contaminant>>CoNS 1 of 2 sets -repeated blood cultures 5/31 due to fever   Essential hypertension - still required cleviprex  last night 5/29 - BPs being done in leg>>will be spuriously higher -continue amlodipine  -aim for SBP 170s to 180s -increase hydralazine  po dose -add losartan  and coreg   Thrombocytopenia - B12--210>>replete - Folic acid--10.2 - Likely secondary to acute medical illness/viral infection - Check INR 1.0  Rash right forearm -check  ANA -check ANCA -likely combo of broken blood vessels and viral infection -LDH - Haptoglobin - INR - UA/culture   Obesity -BMI 30.69 -lifestyle modification                 Family Communication:  son at bedside 5/31   Consultants:  none   Code Status:  FULL    DVT Prophylaxis:   [place] Lovenox      Procedures: As Listed in Progress Note Above   Antibiotics: Vanc 5/28>>5/30 Cefepime  5/28>>5/30             Subjective: Pt complains of dizziness.  Denies cp, sob, n/v/d,, visual disturbance, neck pain, abd pain  Objective: Vitals:   10/28/23 1530 10/28/23 1600 10/28/23 1636 10/28/23 1642  BP: (!) 193/48 (!) 206/48 (!) 147/84   Pulse: 87 88 92   Resp: 14 18 20    Temp:    (P) 98.5 F (36.9 C)  TempSrc:    (P) Oral  SpO2: 97% 99% 96%   Weight:      Height:        Intake/Output Summary (Last 24 hours) at 10/28/2023 1647 Last data filed at 10/28/2023 1504 Gross per 24 hour  Intake 520.2 ml  Output 1050 ml  Net -529.8 ml   Weight change:  Exam:  General:  Pt is alert, follows commands appropriately, not in acute distress HEENT: No icterus, No thrush, No neck mass, [place]/AT Cardiovascular: RRR, S1/S2, no rubs, no gallops Respiratory: CTA bilaterally, no wheezing, no crackles, no rhonchi Abdomen: Soft/+BS, non tender, non distended, no guarding Extremities: No edema, No lymphangitis, No  petechiae,  no synovitis;  non blanching erythema right forearm dorsal surface   Data Reviewed: I have personally reviewed following labs and imaging studies Basic Metabolic Panel: Recent Labs  Lab 10/25/23 1114 10/26/23 0455 10/28/23 0311  NA 137 137 137  K 3.4* 4.0 3.3*  CL 100 106 107  CO2 25 25 21*  GLUCOSE 112* 122* 111*  BUN 14 12 14   CREATININE 0.81 0.74 0.71  CALCIUM 8.8* 8.2* 8.3*   Liver Function Tests: Recent Labs  Lab 10/25/23 1114  AST 28  ALT 26  ALKPHOS 76  BILITOT 0.6  PROT 6.6  ALBUMIN 3.5   No results for input(s): "LIPASE",  "AMYLASE" in the last 168 hours. No results for input(s): "AMMONIA" in the last 168 hours. Coagulation Profile: Recent Labs  Lab 10/26/23 0852  INR 1.0   CBC: Recent Labs  Lab 10/25/23 1114 10/26/23 0455 10/28/23 0311  WBC 4.3 3.9* 3.9*  NEUTROABS 3.4  --   --   HGB 13.8 13.0 12.1  HCT 42.3 39.1 36.6  MCV 86.9 88.1 87.6  PLT 119* 112* 140*   Cardiac Enzymes: No results for input(s): "CKTOTAL", "CKMB", "CKMBINDEX", "TROPONINI" in the last 168 hours. BNP: Invalid input(s): "POCBNP" CBG: No results for input(s): "GLUCAP" in the last 168 hours. HbA1C: No results for input(s): "HGBA1C" in the last 72 hours. Urine analysis:    Component Value Date/Time   COLORURINE STRAW (A) 10/25/2023 1229   APPEARANCEUR CLEAR 10/25/2023 1229   LABSPEC 1.004 (L) 10/25/2023 1229   PHURINE 6.0 10/25/2023 1229   GLUCOSEU NEGATIVE 10/25/2023 1229   HGBUR NEGATIVE 10/25/2023 1229   BILIRUBINUR NEGATIVE 10/25/2023 1229   KETONESUR NEGATIVE 10/25/2023 1229   PROTEINUR NEGATIVE 10/25/2023 1229   NITRITE NEGATIVE 10/25/2023 1229   LEUKOCYTESUR NEGATIVE 10/25/2023 1229   Sepsis Labs: @LABRCNTIP (procalcitonin:4,lacticidven:4) ) Recent Results (from the past 240 hours)  Blood culture (routine x 2)     Status: None (Preliminary result)   Collection Time: 10/25/23 11:14 AM   Specimen: BLOOD  Result Value Ref Range Status   Specimen Description BLOOD LEFT ANTECUBITAL  Final   Special Requests   Final    BOTTLES DRAWN AEROBIC AND ANAEROBIC Blood Culture adequate volume   Culture   Final    NO GROWTH 3 DAYS Performed at [facility], [address], [place], [place] [address]    Report Status PENDING  Incomplete  Resp panel by RT-PCR (RSV, Flu A&B, Covid) Anterior Nasal Swab     Status: None   Collection Time: 10/25/23 12:20 PM   Specimen: Anterior Nasal Swab  Result Value Ref Range Status   SARS Coronavirus 2 by RT PCR NEGATIVE NEGATIVE Final    Comment: (NOTE) SARS-CoV-2 target nucleic  acids are NOT DETECTED.  The SARS-CoV-2 RNA is generally detectable in upper respiratory specimens during the acute phase of infection. The lowest concentration of SARS-CoV-2 viral copies this assay can detect is 138 copies/mL. A negative result does not preclude SARS-Cov-2 infection and should not be used as the sole basis for treatment or other patient management decisions. A negative result may occur with  improper specimen collection/handling, submission of specimen other than nasopharyngeal swab, presence of viral mutation(s) within the areas targeted by this assay, and inadequate number of viral copies(<138 copies/mL). A negative result must be combined with clinical observations, patient history, and epidemiological information. The expected result is Negative.  Fact Sheet for Patients:  BloggerCourse.com  Fact Sheet for Healthcare Providers:  SeriousBroker.it  This test  is no t yet approved or cleared by the [place]  FDA and  has been authorized for detection and/or diagnosis of SARS-CoV-2 by FDA under an Emergency Use Authorization (EUA). This EUA will remain  in effect (meaning this test can be used) for the duration of the COVID-19 declaration under Section 564(b)(1) of the Act, 21 U.S.C.section 360bbb-3(b)(1), unless the authorization is terminated  or revoked sooner.       Influenza A by PCR NEGATIVE NEGATIVE Final   Influenza B by PCR NEGATIVE NEGATIVE Final    Comment: (NOTE) The Xpert Xpress SARS-CoV-2/FLU/RSV plus assay is intended as an aid in the diagnosis of influenza from Nasopharyngeal swab specimens and should not be used as a sole basis for treatment. Nasal washings and aspirates are unacceptable for Xpert Xpress SARS-CoV-2/FLU/RSV testing.  Fact Sheet for Patients: BloggerCourse.com  Fact Sheet for Healthcare Providers: SeriousBroker.it  This  test is not yet approved or cleared by the [place]  FDA and has been authorized for detection and/or diagnosis of SARS-CoV-2 by FDA under an Emergency Use Authorization (EUA). This EUA will remain in effect (meaning this test can be used) for the duration of the COVID-19 declaration under Section 564(b)(1) of the Act, 21 U.S.C. section 360bbb-3(b)(1), unless the authorization is terminated or revoked.     Resp Syncytial Virus by PCR NEGATIVE NEGATIVE Final    Comment: (NOTE) Fact Sheet for Patients: BloggerCourse.com  Fact Sheet for Healthcare Providers: SeriousBroker.it  This test is not yet approved or cleared by the [place]  FDA and has been authorized for detection and/or diagnosis of SARS-CoV-2 by FDA under an Emergency Use Authorization (EUA). This EUA will remain in effect (meaning this test can be used) for the duration of the COVID-19 declaration under Section 564(b)(1) of the Act, 21 U.S.C. section 360bbb-3(b)(1), unless the authorization is terminated or revoked.  Performed at [facility], [address], [place], [place] [address]   Blood culture (routine x 2)     Status: Abnormal   Collection Time: 10/25/23 12:20 PM   Specimen: BLOOD  Result Value Ref Range Status   Specimen Description   Final    BLOOD BLOOD RIGHT ARM Performed at [facility], [address], [place], [place] [address]    Special Requests   Final    BOTTLES DRAWN AEROBIC ONLY Blood Culture results may not be optimal due to an inadequate volume of blood received in culture bottles Performed at [facility], [address], [place], [place] [address]    Culture  Setup Time   Final    AEROBIC BOTTLE ONLY GRAM POSITIVE COCCI Gram Stain Report Called to,Read Back By and Verified With: J KING AT 1147 10/26/23 BY A WILSON CRITICAL RESULT CALLED TO, READ BACK BY AND VERIFIED WITH: RN GEORGE ROOS ON 10/26/23 @ 1725 BY DRT    Culture (A)   Final    STAPHYLOCOCCUS CAPITIS STAPHYLOCOCCUS HOMINIS THE SIGNIFICANCE OF ISOLATING THIS ORGANISM FROM A SINGLE SET OF BLOOD CULTURES WHEN MULTIPLE SETS ARE DRAWN IS UNCERTAIN. PLEASE NOTIFY THE MICROBIOLOGY DEPARTMENT WITHIN ONE WEEK IF SPECIATION AND SENSITIVITIES ARE REQUIRED. Performed at [facility], [address], [place], [place] [address]    Report Status 10/28/2023 FINAL  Final  Blood Culture ID Panel (Reflexed)     Status: Abnormal   Collection Time: 10/25/23 12:20 PM  Result Value Ref Range Status   Enterococcus faecalis NOT DETECTED NOT DETECTED Final   Enterococcus Faecium NOT DETECTED NOT DETECTED Final  Listeria monocytogenes NOT DETECTED NOT DETECTED Final   Staphylococcus species DETECTED (A) NOT DETECTED Final    Comment: CRITICAL RESULT CALLED TO, READ BACK BY AND VERIFIED WITH: RN GEORGE ROOS ON 10/26/23 @ 1725 BY DRT    Staphylococcus aureus (BCID) NOT DETECTED NOT DETECTED Final   Staphylococcus epidermidis NOT DETECTED NOT DETECTED Final   Staphylococcus lugdunensis NOT DETECTED NOT DETECTED Final   Streptococcus species NOT DETECTED NOT DETECTED Final   Streptococcus agalactiae NOT DETECTED NOT DETECTED Final   Streptococcus pneumoniae NOT DETECTED NOT DETECTED Final   Streptococcus pyogenes NOT DETECTED NOT DETECTED Final   A.calcoaceticus-baumannii NOT DETECTED NOT DETECTED Final   Bacteroides fragilis NOT DETECTED NOT DETECTED Final   Enterobacterales NOT DETECTED NOT DETECTED Final   Enterobacter cloacae complex NOT DETECTED NOT DETECTED Final   Escherichia coli NOT DETECTED NOT DETECTED Final   Klebsiella aerogenes NOT DETECTED NOT DETECTED Final   Klebsiella oxytoca NOT DETECTED NOT DETECTED Final   Klebsiella pneumoniae NOT DETECTED NOT DETECTED Final   Proteus species NOT DETECTED NOT DETECTED Final   Salmonella species NOT DETECTED NOT DETECTED Final   Serratia marcescens NOT DETECTED NOT DETECTED Final   Haemophilus influenzae NOT DETECTED  NOT DETECTED Final   Neisseria meningitidis NOT DETECTED NOT DETECTED Final   Pseudomonas aeruginosa NOT DETECTED NOT DETECTED Final   Stenotrophomonas maltophilia NOT DETECTED NOT DETECTED Final   Candida albicans NOT DETECTED NOT DETECTED Final   Candida auris NOT DETECTED NOT DETECTED Final   Candida glabrata NOT DETECTED NOT DETECTED Final   Candida krusei NOT DETECTED NOT DETECTED Final   Candida parapsilosis NOT DETECTED NOT DETECTED Final   Candida tropicalis NOT DETECTED NOT DETECTED Final   Cryptococcus neoformans/gattii NOT DETECTED NOT DETECTED Final    Comment: Performed at [facility], [address] [place], [place] [address]  MRSA Next Gen by PCR, Nasal     Status: None   Collection Time: 10/25/23  7:15 PM   Specimen: Nasal Mucosa; Nasal Swab  Result Value Ref Range Status   MRSA by PCR Next Gen NOT DETECTED NOT DETECTED Final    Comment: (NOTE) The GeneXpert MRSA Assay (FDA approved for NASAL specimens only), is one component of a comprehensive MRSA colonization surveillance program. It is not intended to diagnose MRSA infection nor to guide or monitor treatment for MRSA infections. Test performance is not FDA approved in patients less than 56 years old. Performed at [facility] [address] [place], [place] [address]   Respiratory (~20 pathogens) panel by PCR     Status: Abnormal   Collection Time: 10/26/23  8:59 AM   Specimen: Nasopharyngeal Swab; Respiratory  Result Value Ref Range Status   Adenovirus NOT DETECTED NOT DETECTED Final   Coronavirus 229E NOT DETECTED NOT DETECTED Final    Comment: (NOTE) The Coronavirus on the Respiratory Panel, DOES NOT test for the novel  Coronavirus (2019 nCoV)    Coronavirus HKU1 NOT DETECTED NOT DETECTED Final   Coronavirus NL63 NOT DETECTED NOT DETECTED Final   Coronavirus OC43 NOT DETECTED NOT DETECTED Final   Metapneumovirus NOT DETECTED NOT DETECTED Final   Rhinovirus / Enterovirus NOT DETECTED NOT DETECTED  Final   Influenza A NOT DETECTED NOT DETECTED Final   Influenza B NOT DETECTED NOT DETECTED Final   Parainfluenza Virus 1 NOT DETECTED NOT DETECTED Final   Parainfluenza Virus 2 NOT DETECTED NOT DETECTED Final   Parainfluenza Virus 3 DETECTED (A) NOT DETECTED Final  Parainfluenza Virus 4 NOT DETECTED NOT DETECTED Final   Respiratory Syncytial Virus NOT DETECTED NOT DETECTED Final   Bordetella pertussis NOT DETECTED NOT DETECTED Final   Bordetella Parapertussis NOT DETECTED NOT DETECTED Final   Chlamydophila pneumoniae NOT DETECTED NOT DETECTED Final   Mycoplasma pneumoniae NOT DETECTED NOT DETECTED Final    Comment: Performed at [facility], [address], [place], [place] [address]  Culture, blood (Routine X 2) w Reflex to ID Panel     Status: None (Preliminary result)   Collection Time: 10/28/23 10:59 AM   Specimen: BLOOD  Result Value Ref Range Status   Specimen Description BLOOD RIGHT ANTECUBITAL  Final   Special Requests   Final    BOTTLES DRAWN AEROBIC AND ANAEROBIC Blood Culture adequate volume Performed at [facility], [address] [place], [place] [address]    Culture PENDING  Incomplete   Report Status PENDING  Incomplete  Culture, blood (Routine X 2) w Reflex to ID Panel     Status: None (Preliminary result)   Collection Time: 10/28/23 10:59 AM   Specimen: BLOOD  Result Value Ref Range Status   Specimen Description BLOOD BLOOD RIGHT ARM  Final   Special Requests   Final    BOTTLES DRAWN AEROBIC AND ANAEROBIC Blood Culture adequate volume Performed at [facility], [address], [place], [place] [address]    Culture PENDING  Incomplete   Report Status PENDING  Incomplete     Scheduled Meds:  acetaminophen   1,000 mg Oral Q8H   amLODipine   10 mg Oral Daily   carvedilol  6.25 mg Oral BID WC   Chlorhexidine  Gluconate Cloth  6 each Topical Q0600   enoxaparin  (LOVENOX ) injection  40 mg Subcutaneous Q24H   hydrALAZINE   100 mg Oral Q8H   losartan   50 mg Oral  Daily   vitamin B-12  500 mcg Oral Daily   Continuous Infusions:  clevidipine  Stopped (10/28/23 1056)    Procedures/Studies: CT CHEST ABDOMEN PELVIS W CONTRAST Result Date: 10/26/2023 CLINICAL DATA:  Sepsis.  Fever of unknown origin. EXAM: CT CHEST, ABDOMEN, AND PELVIS WITH CONTRAST TECHNIQUE: Multidetector CT imaging of the chest, abdomen and pelvis was performed following the standard protocol during bolus administration of intravenous contrast. RADIATION DOSE REDUCTION: This exam was performed according to the departmental dose-optimization program which includes automated exposure control, adjustment of the mA and/or kV according to patient size and/or use of iterative reconstruction technique. CONTRAST:  OMNIPAQUE  IOHEXOL  300 MG/ML  SOLN COMPARISON:  Chest radiograph dated 10/17/2018 heart. FINDINGS: CT CHEST FINDINGS Cardiovascular: There is no cardiomegaly or pericardial effusion. The thoracic aorta is unremarkable. The origins of the great vessels of the aortic arch and the central pulmonary arteries are patent. Mediastinum/Nodes: No hilar or mediastinal adenopathy. Small hiatal hernia. The esophagus and the thyroid  gland are grossly unremarkable. No mediastinal fluid collection. Lungs/Pleura: There is a 5 mm subpleural nodule in the right lower lobe. No focal consolidation, pleural effusion, pneumothorax. The central airways are patent. Musculoskeletal: No acute osseous pathology. CT ABDOMEN PELVIS FINDINGS No intra-abdominal free air or free fluid. Hepatobiliary: No focal liver abnormality is seen. No gallstones, gallbladder wall thickening, or biliary dilatation. Pancreas: Unremarkable. No pancreatic ductal dilatation or surrounding inflammatory changes. Spleen: Normal in size without focal abnormality. Adrenals/Urinary Tract: The adrenal glands unremarkable. The kidneys, visualized ureters, and urinary bladder appear unremarkable. Stomach/Bowel: Mild sigmoid diverticulosis. There is no  bowel obstruction or active inflammation. The appendix is normal. Vascular/Lymphatic: Mild aortoiliac  atherosclerotic disease. The IVC is unremarkable. No portal venous gas. There is no adenopathy. Reproductive: The uterus is grossly unremarkable. No suspicious adnexal masses. Other: None Musculoskeletal: No acute or significant osseous findings. IMPRESSION: 1. No acute intrathoracic, abdominal, or pelvic pathology. 2. Mild sigmoid diverticulosis. No bowel obstruction. Normal appendix. 3. A 5 mm subpleural nodule in the right lower lobe. No follow-up needed if patient is low-risk.This recommendation follows the consensus statement: Guidelines for Management of Incidental Pulmonary Nodules Detected on CT Images: From the Fleischner Society 2017; Radiology 2017; 284:[id number]. 4.  Aortic Atherosclerosis (ICD10-I70.0). Electronically Signed   By: Angus Bark M.D.   On: 10/26/2023 17:20   DG Chest Portable 1 View Result Date: 10/25/2023 CLINICAL DATA:  Fever.  Hypertension. EXAM: PORTABLE CHEST 1 VIEW COMPARISON:  08/14/2018 FINDINGS: The lungs are clear without focal pneumonia, edema, pneumothorax or pleural effusion. The cardiopericardial silhouette is within normal limits for size. No acute bony abnormality. IMPRESSION: No active disease. Electronically Signed   By: Donnal Fusi M.D.   On: 10/25/2023 12:03    Demaris Fillers, DO  [facility]  If 7PM-7AM, please contact night-coverage [email or web address] Password [facility] 10/28/2023, 4:47 PM   LOS: 3 days

## 2023-10-28 NOTE — Plan of Care (Signed)

## 2023-10-29 DIAGNOSIS — I160 Hypertensive urgency: Secondary | ICD-10-CM | POA: Diagnosis not present

## 2023-10-29 DIAGNOSIS — R7881 Bacteremia: Secondary | ICD-10-CM | POA: Diagnosis not present

## 2023-10-29 DIAGNOSIS — R509 Fever, unspecified: Secondary | ICD-10-CM | POA: Diagnosis not present

## 2023-10-29 LAB — COMPREHENSIVE METABOLIC PANEL WITH GFR
ALT: 103 U/L — ABNORMAL HIGH (ref 0–44)
AST: 82 U/L — ABNORMAL HIGH (ref 15–41)
Albumin: 3 g/dL — ABNORMAL LOW (ref 3.5–5.0)
Alkaline Phosphatase: 100 U/L (ref 38–126)
Anion gap: 10 (ref 5–15)
BUN: 22 mg/dL (ref 8–23)
CO2: 21 mmol/L — ABNORMAL LOW (ref 22–32)
Calcium: 8.4 mg/dL — ABNORMAL LOW (ref 8.9–10.3)
Chloride: 102 mmol/L (ref 98–111)
Creatinine, Ser: 1.06 mg/dL — ABNORMAL HIGH (ref 0.44–1.00)
GFR, Estimated: 58 mL/min — ABNORMAL LOW (ref >60)
Glucose, Bld: 98 mg/dL (ref 70–99)
Potassium: 3.6 mmol/L (ref 3.5–5.1)
Sodium: 133 mmol/L — ABNORMAL LOW (ref 135–145)
Total Bilirubin: 0.5 mg/dL (ref 0.0–1.2)
Total Protein: 6 g/dL — ABNORMAL LOW (ref 6.5–8.1)

## 2023-10-29 LAB — CBC WITH DIFFERENTIAL/PLATELET
Abs Immature Granulocytes: 0.04 10*3/uL (ref 0.00–0.07)
Basophils Absolute: 0 10*3/uL (ref 0.0–0.1)
Basophils Relative: 1 %
Eosinophils Absolute: 0.2 10*3/uL (ref 0.0–0.5)
Eosinophils Relative: 4 %
HCT: 36.8 % (ref 36.0–46.0)
Hemoglobin: 12.1 g/dL (ref 12.0–15.0)
Immature Granulocytes: 1 %
Lymphocytes Relative: 20 %
Lymphs Abs: 1 10*3/uL (ref 0.7–4.0)
MCH: 29.1 pg (ref 26.0–34.0)
MCHC: 32.9 g/dL (ref 30.0–36.0)
MCV: 88.5 fL (ref 80.0–100.0)
Monocytes Absolute: 0.5 10*3/uL (ref 0.1–1.0)
Monocytes Relative: 10 %
Neutro Abs: 3.1 10*3/uL (ref 1.7–7.7)
Neutrophils Relative %: 64 %
Platelets: 191 10*3/uL (ref 150–400)
RBC: 4.16 MIL/uL (ref 3.87–5.11)
RDW: 14.1 % (ref 11.5–15.5)
WBC: 4.8 10*3/uL (ref 4.0–10.5)
nRBC: 0 % (ref 0.0–0.2)

## 2023-10-29 LAB — PROTIME-INR
INR: 1 (ref 0.8–1.2)
Prothrombin Time: 13.6 s (ref 11.4–15.2)

## 2023-10-29 LAB — LACTATE DEHYDROGENASE: LDH: 210 U/L — ABNORMAL HIGH (ref 98–192)

## 2023-10-29 MED ORDER — CEPHALEXIN 250 MG PO CAPS
500.0000 mg | ORAL_CAPSULE | Freq: Three times a day (TID) | ORAL | Status: DC
  Administered 2023-10-29 – 2023-10-30 (×3): 500 mg via ORAL
  Filled 2023-10-29 (×3): qty 2

## 2023-10-29 MED ORDER — MELATONIN 3 MG PO TABS
6.0000 mg | ORAL_TABLET | Freq: Every evening | ORAL | Status: DC | PRN
  Administered 2023-10-29: 6 mg via ORAL
  Filled 2023-10-29: qty 2

## 2023-10-29 MED ORDER — POTASSIUM CHLORIDE IN NACL 20-0.9 MEQ/L-% IV SOLN: INTRAVENOUS | Status: DC

## 2023-10-29 NOTE — Plan of Care (Signed)

## 2023-10-29 NOTE — Plan of Care (Signed)
  Problem: Clinical Measurements: Goal: Ability to maintain clinical measurements within normal limits will improve Outcome: Not Progressing Goal: Cardiovascular complication will be avoided Outcome: Not Progressing   

## 2023-10-29 NOTE — Progress Notes (Signed)
 PROGRESS NOTE  Miranda Decker OZH:[id number] DOB: 04-26-1956 DOA: 10/25/2023 PCP: Patient, No Pcp Per  Brief History:  68 year old female with a history of hypertension and hyperlipidemia presenting with 5-day history of fevers up to 103.4 F at home.  The patient denies any recent travels.  She denies any sick contacts.  She has had some myalgias and arthralgias.  She has 1 dog but denies any exotic pets.  She denies any recent hiking, camping, or outdoor activities.  She denies any headache, chest pain, coughing, hemoptysis, nausea, vomiting, diarrhea, abdominal pain, dysuria. She has had some nasal congestion which she is attributing to "allergies". In the ED, the patient was febrile to 101.2 F.  She was hemodynamically stable with oxygen saturation 99% room air.  WBC 4.3, hemoglobin 13.8, platelets 119.  Sodium 137, potassium 3.4, bicarbonate 25, serum creatinine 0.81.  LFTs unremarkable.  UA was negative for pyuria.  Blood cultures were obtained, and the patient was started on vancomycin  and cefepime . She was noted to have a blood pressure of 250 50/91.  She was started on Cleviprex .   Assessment/Plan: Fever  - UA negative for pyuria - Follow blood cultures--CoNS one of two sets = contaminant - Chest x-ray negative for infiltrates - Obtain CT chest, abdomen, pelvis--no acute findings - COVID-19 PCR negative - Check viral respiratory panel +Parainfluenza 3 - PCT 0.28 - d/c vanc and cefepime    Bacteremia -represents contaminant>>CoNS 1 of 2 sets -repeated blood cultures 5/31 due to fever  Pyuria -10/28/23 UA 21-50 WBC -empiric cephalexin pending culture data   Essential hypertension - still required cleviprex  last night 5/29 - BPs being done in leg>>will be spuriously higher -continue amlodipine  -aim for SBP 170s to 180s -increase hydralazine  po dose -added losartan  and coreg   Thrombocytopenia - B12--210>>replete - Folic acid--10.2 - Likely secondary to acute  medical illness/viral infection - Check INR 1.0 - no splenomegaly on CT AP - gradually improving   Rash right forearm -check ANA -check ANCA -likely combo of broken blood vessels and viral infection -LDH--210 - Haptoglobin--pending - INR--1.0 - UA/culture--100 protein   Obesity -BMI 30.69 -lifestyle modification                 Family Communication:  son at bedside 5/31   Consultants:  none   Code Status:  FULL    DVT Prophylaxis:   [place] Lovenox      Procedures: As Listed in Progress Note Above   Antibiotics: Vanc 5/28>>5/30 Cefepime  5/28>>5/30            Subjective: Patient denies fevers, chills, headache, chest pain, dyspnea, nausea, vomiting, diarrhea, abdominal pain, dysuria, hematuria, hematochezia, and melena.   Objective: Vitals:   10/29/23 1335 10/29/23 1400 10/29/23 1500 10/29/23 1530  BP: (!) 145/47 (!) 153/44 (!) 155/52 (!) 160/41  Pulse: 70 70 80 85  Resp: 18 19 14 15   Temp:      TempSrc:      SpO2: 99% 100% 98% 98%  Weight:      Height:        Intake/Output Summary (Last 24 hours) at 10/29/2023 1554 Last data filed at 10/29/2023 1105 Gross per 24 hour  Intake 21.43 ml  Output 650 ml  Net -628.57 ml   Weight change:  Exam:  General:  Pt is alert, follows commands appropriately, not in acute distress HEENT: No icterus, No thrush, No neck mass, [place]/AT Cardiovascular: RRR, S1/S2, no rubs, no gallops Respiratory:  fine bibasilar rales.  No wheeze Abdomen: Soft/+BS, non tender, non distended, no guarding Extremities: No edema, No lymphangitis, No petechiae, No rashes, no synovitis   Data Reviewed: I have personally reviewed following labs and imaging studies Basic Metabolic Panel: Recent Labs  Lab 10/25/23 1114 10/26/23 0455 10/28/23 0311 10/29/23 0501  NA 137 137 137 133*  K 3.4* 4.0 3.3* 3.6  CL 100 106 107 102  CO2 25 25 21* 21*  GLUCOSE 112* 122* 111* 98  BUN 14 12 14 22   CREATININE 0.81 0.74 0.71 1.06*  CALCIUM  8.8* 8.2* 8.3* 8.4*   Liver Function Tests: Recent Labs  Lab 10/25/23 1114 10/29/23 0501  AST 28 82*  ALT 26 103*  ALKPHOS 76 100  BILITOT 0.6 0.5  PROT 6.6 6.0*  ALBUMIN 3.5 3.0*   No results for input(s): "LIPASE", "AMYLASE" in the last 168 hours. No results for input(s): "AMMONIA" in the last 168 hours. Coagulation Profile: Recent Labs  Lab 10/26/23 0852 10/29/23 0500  INR 1.0 1.0   CBC: Recent Labs  Lab 10/25/23 1114 10/26/23 0455 10/28/23 0311 10/29/23 0501  WBC 4.3 3.9* 3.9* 4.8  NEUTROABS 3.4  --   --  3.1  HGB 13.8 13.0 12.1 12.1  HCT 42.3 39.1 36.6 36.8  MCV 86.9 88.1 87.6 88.5  PLT 119* 112* 140* 191   Cardiac Enzymes: No results for input(s): "CKTOTAL", "CKMB", "CKMBINDEX", "TROPONINI" in the last 168 hours. BNP: Invalid input(s): "POCBNP" CBG: No results for input(s): "GLUCAP" in the last 168 hours. HbA1C: No results for input(s): "HGBA1C" in the last 72 hours. Urine analysis:    Component Value Date/Time   COLORURINE AMBER (A) 10/28/2023 2143   APPEARANCEUR HAZY (A) 10/28/2023 2143   LABSPEC 1.019 10/28/2023 2143   PHURINE 5.0 10/28/2023 2143   GLUCOSEU NEGATIVE 10/28/2023 2143   HGBUR NEGATIVE 10/28/2023 2143   BILIRUBINUR NEGATIVE 10/28/2023 2143   KETONESUR 5 (A) 10/28/2023 2143   PROTEINUR 100 (A) 10/28/2023 2143   NITRITE NEGATIVE 10/28/2023 2143   LEUKOCYTESUR NEGATIVE 10/28/2023 2143   Sepsis Labs: @LABRCNTIP (procalcitonin:4,lacticidven:4) ) Recent Results (from the past 240 hours)  Blood culture (routine x 2)     Status: None (Preliminary result)   Collection Time: 10/25/23 11:14 AM   Specimen: BLOOD  Result Value Ref Range Status   Specimen Description BLOOD LEFT ANTECUBITAL  Final   Special Requests   Final    BOTTLES DRAWN AEROBIC AND ANAEROBIC Blood Culture adequate volume   Culture   Final    NO GROWTH 4 DAYS Performed at [facility], [address], [place], [place] [address]    Report Status PENDING  Incomplete   Resp panel by RT-PCR (RSV, Flu A&B, Covid) Anterior Nasal Swab     Status: None   Collection Time: 10/25/23 12:20 PM   Specimen: Anterior Nasal Swab  Result Value Ref Range Status   SARS Coronavirus 2 by RT PCR NEGATIVE NEGATIVE Final    Comment: (NOTE) SARS-CoV-2 target nucleic acids are NOT DETECTED.  The SARS-CoV-2 RNA is generally detectable in upper respiratory specimens during the acute phase of infection. The lowest concentration of SARS-CoV-2 viral copies this assay can detect is 138 copies/mL. A negative result does not preclude SARS-Cov-2 infection and should not be used as the sole basis for treatment or other patient management decisions. A negative result may occur with  improper specimen collection/handling, submission of specimen other than nasopharyngeal swab, presence of viral mutation(s) within the areas targeted by this assay,  and inadequate number of viral copies(<138 copies/mL). A negative result must be combined with clinical observations, patient history, and epidemiological information. The expected result is Negative.  Fact Sheet for Patients:  BloggerCourse.com  Fact Sheet for Healthcare Providers:  SeriousBroker.it  This test is no t yet approved or cleared by the [place]  FDA and  has been authorized for detection and/or diagnosis of SARS-CoV-2 by FDA under an Emergency Use Authorization (EUA). This EUA will remain  in effect (meaning this test can be used) for the duration of the COVID-19 declaration under Section 564(b)(1) of the Act, 21 U.S.C.section 360bbb-3(b)(1), unless the authorization is terminated  or revoked sooner.       Influenza A by PCR NEGATIVE NEGATIVE Final   Influenza B by PCR NEGATIVE NEGATIVE Final    Comment: (NOTE) The Xpert Xpress SARS-CoV-2/FLU/RSV plus assay is intended as an aid in the diagnosis of influenza from Nasopharyngeal swab specimens and should not be used  as a sole basis for treatment. Nasal washings and aspirates are unacceptable for Xpert Xpress SARS-CoV-2/FLU/RSV testing.  Fact Sheet for Patients: BloggerCourse.com  Fact Sheet for Healthcare Providers: SeriousBroker.it  This test is not yet approved or cleared by the [place]  FDA and has been authorized for detection and/or diagnosis of SARS-CoV-2 by FDA under an Emergency Use Authorization (EUA). This EUA will remain in effect (meaning this test can be used) for the duration of the COVID-19 declaration under Section 564(b)(1) of the Act, 21 U.S.C. section 360bbb-3(b)(1), unless the authorization is terminated or revoked.     Resp Syncytial Virus by PCR NEGATIVE NEGATIVE Final    Comment: (NOTE) Fact Sheet for Patients: BloggerCourse.com  Fact Sheet for Healthcare Providers: SeriousBroker.it  This test is not yet approved or cleared by the [place]  FDA and has been authorized for detection and/or diagnosis of SARS-CoV-2 by FDA under an Emergency Use Authorization (EUA). This EUA will remain in effect (meaning this test can be used) for the duration of the COVID-19 declaration under Section 564(b)(1) of the Act, 21 U.S.C. section 360bbb-3(b)(1), unless the authorization is terminated or revoked.  Performed at [facility], [address] [place], [place] [address]   Blood culture (routine x 2)     Status: Abnormal   Collection Time: 10/25/23 12:20 PM   Specimen: BLOOD  Result Value Ref Range Status   Specimen Description   Final    BLOOD BLOOD RIGHT ARM Performed at [facility], [address], [place], [place] [address]    Special Requests   Final    BOTTLES DRAWN AEROBIC ONLY Blood Culture results may not be optimal due to an inadequate volume of blood received in culture bottles Performed at [facility], [address] [place], [place] [address]     Culture  Setup Time   Final    AEROBIC BOTTLE ONLY GRAM POSITIVE COCCI Gram Stain Report Called to,Read Back By and Verified With: J KING AT 1147 10/26/23 BY A [place] CRITICAL RESULT CALLED TO, READ BACK BY AND VERIFIED WITH: RN GEORGE ROOS ON 10/26/23 @ 1725 BY DRT    Culture (A)  Final    STAPHYLOCOCCUS CAPITIS STAPHYLOCOCCUS HOMINIS THE SIGNIFICANCE OF ISOLATING THIS ORGANISM FROM A SINGLE SET OF BLOOD CULTURES WHEN MULTIPLE SETS ARE DRAWN IS UNCERTAIN. PLEASE NOTIFY THE MICROBIOLOGY DEPARTMENT WITHIN ONE WEEK IF SPECIATION AND SENSITIVITIES ARE REQUIRED. Performed at [facility], [address], [place], [place] [address]    Report Status 10/28/2023 FINAL  Final  Blood Culture ID Panel (Reflexed)     Status: Abnormal   Collection Time: 10/25/23 12:20 PM  Result Value Ref Range Status   Enterococcus faecalis NOT DETECTED NOT DETECTED Final   Enterococcus Faecium NOT DETECTED NOT DETECTED Final   Listeria monocytogenes NOT DETECTED NOT DETECTED Final   Staphylococcus species DETECTED (A) NOT DETECTED Final    Comment: CRITICAL RESULT CALLED TO, READ BACK BY AND VERIFIED WITH: RN GEORGE ROOS ON 10/26/23 @ 1725 BY DRT    Staphylococcus aureus (BCID) NOT DETECTED NOT DETECTED Final   Staphylococcus epidermidis NOT DETECTED NOT DETECTED Final   Staphylococcus lugdunensis NOT DETECTED NOT DETECTED Final   Streptococcus species NOT DETECTED NOT DETECTED Final   Streptococcus agalactiae NOT DETECTED NOT DETECTED Final   Streptococcus pneumoniae NOT DETECTED NOT DETECTED Final   Streptococcus pyogenes NOT DETECTED NOT DETECTED Final   A.calcoaceticus-baumannii NOT DETECTED NOT DETECTED Final   Bacteroides fragilis NOT DETECTED NOT DETECTED Final   Enterobacterales NOT DETECTED NOT DETECTED Final   Enterobacter cloacae complex NOT DETECTED NOT DETECTED Final   Escherichia coli NOT DETECTED NOT DETECTED Final   Klebsiella aerogenes NOT DETECTED NOT DETECTED Final   Klebsiella oxytoca  NOT DETECTED NOT DETECTED Final   Klebsiella pneumoniae NOT DETECTED NOT DETECTED Final   Proteus species NOT DETECTED NOT DETECTED Final   Salmonella species NOT DETECTED NOT DETECTED Final   Serratia marcescens NOT DETECTED NOT DETECTED Final   Haemophilus influenzae NOT DETECTED NOT DETECTED Final   Neisseria meningitidis NOT DETECTED NOT DETECTED Final   Pseudomonas aeruginosa NOT DETECTED NOT DETECTED Final   Stenotrophomonas maltophilia NOT DETECTED NOT DETECTED Final   Candida albicans NOT DETECTED NOT DETECTED Final   Candida auris NOT DETECTED NOT DETECTED Final   Candida glabrata NOT DETECTED NOT DETECTED Final   Candida krusei NOT DETECTED NOT DETECTED Final   Candida parapsilosis NOT DETECTED NOT DETECTED Final   Candida tropicalis NOT DETECTED NOT DETECTED Final   Cryptococcus neoformans/gattii NOT DETECTED NOT DETECTED Final    Comment: Performed at [facility], [address] [place], [place] [address]  MRSA Next Gen by PCR, Nasal     Status: None   Collection Time: 10/25/23  7:15 PM   Specimen: Nasal Mucosa; Nasal Swab  Result Value Ref Range Status   MRSA by PCR Next Gen NOT DETECTED NOT DETECTED Final    Comment: (NOTE) The GeneXpert MRSA Assay (FDA approved for NASAL specimens only), is one component of a comprehensive MRSA colonization surveillance program. It is not intended to diagnose MRSA infection nor to guide or monitor treatment for MRSA infections. Test performance is not FDA approved in patients less than 52 years old. Performed at [facility], [address], [place], [place] [address]   Respiratory (~20 pathogens) panel by PCR     Status: Abnormal   Collection Time: 10/26/23  8:59 AM   Specimen: Nasopharyngeal Swab; Respiratory  Result Value Ref Range Status   Adenovirus NOT DETECTED NOT DETECTED Final   Coronavirus 229E NOT DETECTED NOT DETECTED Final    Comment: (NOTE) The Coronavirus on the Respiratory Panel, DOES NOT test for the novel   Coronavirus (2019 nCoV)    Coronavirus HKU1 NOT DETECTED NOT DETECTED Final   Coronavirus NL63 NOT DETECTED NOT DETECTED Final   Coronavirus OC43 NOT DETECTED NOT DETECTED Final   Metapneumovirus NOT DETECTED NOT DETECTED Final   Rhinovirus / Enterovirus NOT DETECTED NOT DETECTED Final   Influenza A  NOT DETECTED NOT DETECTED Final   Influenza B NOT DETECTED NOT DETECTED Final   Parainfluenza Virus 1 NOT DETECTED NOT DETECTED Final   Parainfluenza Virus 2 NOT DETECTED NOT DETECTED Final   Parainfluenza Virus 3 DETECTED (A) NOT DETECTED Final   Parainfluenza Virus 4 NOT DETECTED NOT DETECTED Final   Respiratory Syncytial Virus NOT DETECTED NOT DETECTED Final   Bordetella pertussis NOT DETECTED NOT DETECTED Final   Bordetella Parapertussis NOT DETECTED NOT DETECTED Final   Chlamydophila pneumoniae NOT DETECTED NOT DETECTED Final   Mycoplasma pneumoniae NOT DETECTED NOT DETECTED Final    Comment: Performed at [facility], [address], [place], [place] [address]  Culture, blood (Routine X 2) w Reflex to ID Panel     Status: None (Preliminary result)   Collection Time: 10/28/23 10:59 AM   Specimen: BLOOD  Result Value Ref Range Status   Specimen Description BLOOD RIGHT ANTECUBITAL  Final   Special Requests   Final    BOTTLES DRAWN AEROBIC AND ANAEROBIC Blood Culture adequate volume   Culture   Final    NO GROWTH < 24 HOURS Performed at [facility], [address] [place], [place] [address]    Report Status PENDING  Incomplete  Culture, blood (Routine X 2) w Reflex to ID Panel     Status: None (Preliminary result)   Collection Time: 10/28/23 10:59 AM   Specimen: BLOOD  Result Value Ref Range Status   Specimen Description BLOOD BLOOD RIGHT ARM  Final   Special Requests   Final    BOTTLES DRAWN AEROBIC AND ANAEROBIC Blood Culture adequate volume   Culture   Final    NO GROWTH < 24 HOURS Performed at [facility], [address], [place], [place] [address]    Report Status  PENDING  Incomplete     Scheduled Meds:  acetaminophen   1,000 mg Oral Q8H   amLODipine   10 mg Oral Daily   carvedilol  6.25 mg Oral BID WC   cephALEXin  500 mg Oral Q8H   Chlorhexidine  Gluconate Cloth  6 each Topical Q0600   enoxaparin  (LOVENOX ) injection  40 mg Subcutaneous Q24H   hydrALAZINE   100 mg Oral Q8H   losartan   50 mg Oral Daily   vitamin B-12  500 mcg Oral Daily   Continuous Infusions:  0.9 % NaCl with KCl 20 mEq / L     clevidipine  Stopped (10/29/23 1007)    Procedures/Studies: CT CHEST ABDOMEN PELVIS W CONTRAST Result Date: 10/26/2023 CLINICAL DATA:  Sepsis.  Fever of unknown origin. EXAM: CT CHEST, ABDOMEN, AND PELVIS WITH CONTRAST TECHNIQUE: Multidetector CT imaging of the chest, abdomen and pelvis was performed following the standard protocol during bolus administration of intravenous contrast. RADIATION DOSE REDUCTION: This exam was performed according to the departmental dose-optimization program which includes automated exposure control, adjustment of the mA and/or kV according to patient size and/or use of iterative reconstruction technique. CONTRAST:  OMNIPAQUE  IOHEXOL  300 MG/ML  SOLN COMPARISON:  Chest radiograph dated 10/17/2018 heart. FINDINGS: CT CHEST FINDINGS Cardiovascular: There is no cardiomegaly or pericardial effusion. The thoracic aorta is unremarkable. The origins of the great vessels of the aortic arch and the central pulmonary arteries are patent. Mediastinum/Nodes: No hilar or mediastinal adenopathy. Small hiatal hernia. The esophagus and the thyroid  gland are grossly unremarkable. No mediastinal fluid collection. Lungs/Pleura: There is a 5 mm subpleural nodule in the right lower lobe. No focal consolidation, pleural effusion, pneumothorax. The central airways are patent.  Musculoskeletal: No acute osseous pathology. CT ABDOMEN PELVIS FINDINGS No intra-abdominal free air or free fluid. Hepatobiliary: No focal liver abnormality is seen. No gallstones,  gallbladder wall thickening, or biliary dilatation. Pancreas: Unremarkable. No pancreatic ductal dilatation or surrounding inflammatory changes. Spleen: Normal in size without focal abnormality. Adrenals/Urinary Tract: The adrenal glands unremarkable. The kidneys, visualized ureters, and urinary bladder appear unremarkable. Stomach/Bowel: Mild sigmoid diverticulosis. There is no bowel obstruction or active inflammation. The appendix is normal. Vascular/Lymphatic: Mild aortoiliac atherosclerotic disease. The IVC is unremarkable. No portal venous gas. There is no adenopathy. Reproductive: The uterus is grossly unremarkable. No suspicious adnexal masses. Other: None Musculoskeletal: No acute or significant osseous findings. IMPRESSION: 1. No acute intrathoracic, abdominal, or pelvic pathology. 2. Mild sigmoid diverticulosis. No bowel obstruction. Normal appendix. 3. A 5 mm subpleural nodule in the right lower lobe. No follow-up needed if patient is low-risk.This recommendation follows the consensus statement: Guidelines for Management of Incidental Pulmonary Nodules Detected on CT Images: From the Fleischner Society 2017; Radiology 2017; 284:228-243. 4.  Aortic Atherosclerosis (ICD10-I70.0). Electronically Signed   By: Angus Bark M.D.   On: 10/26/2023 17:20   DG Chest Portable 1 View Result Date: 10/25/2023 CLINICAL DATA:  Fever.  Hypertension. EXAM: PORTABLE CHEST 1 VIEW COMPARISON:  08/14/2018 FINDINGS: The lungs are clear without focal pneumonia, edema, pneumothorax or pleural effusion. The cardiopericardial silhouette is within normal limits for size. No acute bony abnormality. IMPRESSION: No active disease. Electronically Signed   By: Donnal Fusi M.D.   On: 10/25/2023 12:03    Demaris Fillers, DO  [facility]  If 7PM-7AM, please contact night-coverage [email or web address] Password [email or web address] 10/29/2023, 3:54 PM   LOS: 4 days

## 2023-10-30 DIAGNOSIS — I160 Hypertensive urgency: Secondary | ICD-10-CM | POA: Diagnosis not present

## 2023-10-30 DIAGNOSIS — R7881 Bacteremia: Secondary | ICD-10-CM | POA: Diagnosis not present

## 2023-10-30 DIAGNOSIS — R509 Fever, unspecified: Secondary | ICD-10-CM | POA: Diagnosis not present

## 2023-10-30 LAB — CBC
HCT: 33.3 % — ABNORMAL LOW (ref 36.0–46.0)
Hemoglobin: 11.1 g/dL — ABNORMAL LOW (ref 12.0–15.0)
MCH: 28.9 pg (ref 26.0–34.0)
MCHC: 33.3 g/dL (ref 30.0–36.0)
MCV: 86.7 fL (ref 80.0–100.0)
Platelets: 194 10*3/uL (ref 150–400)
RBC: 3.84 MIL/uL — ABNORMAL LOW (ref 3.87–5.11)
RDW: 14 % (ref 11.5–15.5)
WBC: 4.8 10*3/uL (ref 4.0–10.5)
nRBC: 0 % (ref 0.0–0.2)

## 2023-10-30 LAB — CULTURE, BLOOD (ROUTINE X 2)
Culture: NO GROWTH
Special Requests: ADEQUATE

## 2023-10-30 LAB — COMPREHENSIVE METABOLIC PANEL WITH GFR
ALT: 95 U/L — ABNORMAL HIGH (ref 0–44)
AST: 69 U/L — ABNORMAL HIGH (ref 15–41)
Albumin: 2.7 g/dL — ABNORMAL LOW (ref 3.5–5.0)
Alkaline Phosphatase: 89 U/L (ref 38–126)
Anion gap: 10 (ref 5–15)
BUN: 24 mg/dL — ABNORMAL HIGH (ref 8–23)
CO2: 20 mmol/L — ABNORMAL LOW (ref 22–32)
Calcium: 8.1 mg/dL — ABNORMAL LOW (ref 8.9–10.3)
Chloride: 104 mmol/L (ref 98–111)
Creatinine, Ser: 0.95 mg/dL (ref 0.44–1.00)
GFR, Estimated: >60 mL/min (ref >60)
Glucose, Bld: 107 mg/dL — ABNORMAL HIGH (ref 70–99)
Potassium: 3.9 mmol/L (ref 3.5–5.1)
Sodium: 134 mmol/L — ABNORMAL LOW (ref 135–145)
Total Bilirubin: 0.6 mg/dL (ref 0.0–1.2)
Total Protein: 5.4 g/dL — ABNORMAL LOW (ref 6.5–8.1)

## 2023-10-30 LAB — URINE CULTURE: Culture: NO GROWTH

## 2023-10-30 LAB — PROTIME-INR
INR: 1 (ref 0.8–1.2)
Prothrombin Time: 13.5 s (ref 11.4–15.2)

## 2023-10-30 MED ORDER — CYANOCOBALAMIN 500 MCG PO TABS: 500.0000 ug | ORAL_TABLET | Freq: Every day | ORAL | Status: AC

## 2023-10-30 MED ORDER — LOSARTAN POTASSIUM 50 MG PO TABS: 50.0000 mg | ORAL_TABLET | Freq: Every day | ORAL | 2 refills | Status: DC

## 2023-10-30 MED ORDER — AMLODIPINE BESYLATE 10 MG PO TABS
10.0000 mg | ORAL_TABLET | Freq: Every day | ORAL | 2 refills | Status: DC
Start: 2023-10-30 — End: 2023-12-25

## 2023-10-30 MED ORDER — CEPHALEXIN 500 MG PO CAPS: 500.0000 mg | ORAL_CAPSULE | Freq: Three times a day (TID) | ORAL | 0 refills | Status: DC

## 2023-10-30 MED ORDER — CARVEDILOL 6.25 MG PO TABS: 6.2500 mg | ORAL_TABLET | Freq: Two times a day (BID) | ORAL | 2 refills | Status: DC

## 2023-10-30 MED ORDER — HYDRALAZINE HCL 100 MG PO TABS: 100.0000 mg | ORAL_TABLET | Freq: Three times a day (TID) | ORAL | 2 refills | Status: DC

## 2023-10-30 NOTE — Plan of Care (Signed)

## 2023-10-30 NOTE — Discharge Summary (Signed)
 Physician Discharge Summary   Patient: Miranda Decker MRN: [id number] DOB: 20-Apr-1956  Admit date:     10/25/2023  Discharge date: 10/30/23  Discharge Physician: Myrtie Atkinson Dezire Turk   PCP: Patient, No Pcp Per   Recommendations at discharge:   Please follow up with primary care provider within 1-2 weeks  Please repeat BMP and CBC in one week      Hospital Course: 68 year old female with a history of hypertension and hyperlipidemia presenting with 5-day history of fevers up to 103.4 F at home.  The patient denies any recent travels.  She denies any sick contacts.  She has had some myalgias and arthralgias.  She has 1 dog but denies any exotic pets.  She denies any recent hiking, camping, or outdoor activities.  She denies any headache, chest pain, coughing, hemoptysis, nausea, vomiting, diarrhea, abdominal pain, dysuria. She has had some nasal congestion which she is attributing to "allergies". In the ED, the patient was febrile to 101.2 F.  She was hemodynamically stable with oxygen saturation 99% room air.  WBC 4.3, hemoglobin 13.8, platelets 119.  Sodium 137, potassium 3.4, bicarbonate 25, serum creatinine 0.81.  LFTs unremarkable.  UA was negative for pyuria.  Blood cultures were obtained, and the patient was started on vancomycin  and cefepime . She was noted to have a blood pressure of 250/91.  She was started on Cleviprex .  Patient's global improvement was slowed secondary to her persistent fevers and hypertension.  Oral antihypertensive medications were gradually titrated.  She required the Cleviprex  drip intermittently during the hospitalization.  Gradually, the patient's fevers trended down and improved.  Cleviprex  drip was ultimately off.   Assessment and Plan: Fever  - UA negative for pyuria - Follow blood cultures--CoNS one of two sets = contaminant - Chest x-ray negative for infiltrates - Obtain CT chest, abdomen, pelvis--no acute findings - COVID-19 PCR negative - Check viral respiratory  panel +Parainfluenza 3 - PCT 0.28 - d/c vanc and cefepime    Bacteremia -represents contaminant>>CoNS 1 of 2 sets -repeated blood cultures 5/31 due to fever   Pyuria -10/28/23 UA 21-50 WBC -empiric cephalexin pending culture data - culture pending at time of dc -d/c home with 4 days cephalexin   Essential hypertension - still required cleviprex  last night 5/29 - BPs being done in leg>>will be spuriously higher -continue amlodipine  -aim for SBP 170s to 180s -increase hydralazine  po dose -added losartan  and coreg   Thrombocytopenia - B12--210>>replete - Folic acid--10.2 - Likely secondary to acute medical illness/viral infection - Check INR 1.0 - no splenomegaly on CT AP - gradually improving   Rash right forearm -check ANA--pending at time of dc -check ANCA--pending at time of dc -likely combo of broken blood vessels and viral infection -LDH--210 - Haptoglobin--pending - INR--1.0 - UA/culture--100 protein   Obesity -BMI 30.69 -lifestyle modification         Consultants: none Procedures performed: none  Disposition: Home Diet recommendation:  Cardiac diet DISCHARGE MEDICATION: Allergies as of 10/30/2023       Reactions   Lisinopril Other (See Comments)   Passes out    Other Other (See Comments)   4 other bp medications Vertigo, off balance, dizziness, disoriented        Medication List     TAKE these medications    acetaminophen  500 MG tablet Commonly known as: TYLENOL  Take 1,000 mg by mouth every 6 (six) hours as needed for fever or mild pain (pain score 1-3).   amLODipine  10 MG tablet Commonly known as:  NORVASC  Take 1 tablet (10 mg total) by mouth daily.   carvedilol 6.25 MG tablet Commonly known as: COREG Take 1 tablet (6.25 mg total) by mouth 2 (two) times daily with a meal.   cephALEXin 500 MG capsule Commonly known as: KEFLEX Take 1 capsule (500 mg total) by mouth every 8 (eight) hours.   cyanocobalamin  500 MCG tablet Commonly  known as: VITAMIN B12 Take 1 tablet (500 mcg total) by mouth daily.   hydrALAZINE  100 MG tablet Commonly known as: APRESOLINE  Take 1 tablet (100 mg total) by mouth every 8 (eight) hours.   losartan  50 MG tablet Commonly known as: COZAAR  Take 1 tablet (50 mg total) by mouth daily.        Discharge Exam: Filed Weights   10/25/23 2145 10/26/23 0550 10/30/23 0627  Weight: 76.1 kg 76.1 kg 76.2 kg   HEENT:  [place]/AT, No thrush, no icterus CV:  RRR, no rub, no S3, no S4 Lung:  CTA, no wheeze, no rhonchi Abd:  soft/+BS, NT Ext:  No edema, no lymphangitis, no synovitis, no rash   Condition at discharge: stable  The results of significant diagnostics from this hospitalization (including imaging, microbiology, ancillary and laboratory) are listed below for reference.   Imaging Studies: CT CHEST ABDOMEN PELVIS W CONTRAST Result Date: 10/26/2023 CLINICAL DATA:  Sepsis.  Fever of unknown origin. EXAM: CT CHEST, ABDOMEN, AND PELVIS WITH CONTRAST TECHNIQUE: Multidetector CT imaging of the chest, abdomen and pelvis was performed following the standard protocol during bolus administration of intravenous contrast. RADIATION DOSE REDUCTION: This exam was performed according to the departmental dose-optimization program which includes automated exposure control, adjustment of the mA and/or kV according to patient size and/or use of iterative reconstruction technique. CONTRAST:  OMNIPAQUE  IOHEXOL  300 MG/ML  SOLN COMPARISON:  Chest radiograph dated 10/17/2018 heart. FINDINGS: CT CHEST FINDINGS Cardiovascular: There is no cardiomegaly or pericardial effusion. The thoracic aorta is unremarkable. The origins of the great vessels of the aortic arch and the central pulmonary arteries are patent. Mediastinum/Nodes: No hilar or mediastinal adenopathy. Small hiatal hernia. The esophagus and the thyroid  gland are grossly unremarkable. No mediastinal fluid collection. Lungs/Pleura: There is a 5 mm subpleural  nodule in the right lower lobe. No focal consolidation, pleural effusion, pneumothorax. The central airways are patent. Musculoskeletal: No acute osseous pathology. CT ABDOMEN PELVIS FINDINGS No intra-abdominal free air or free fluid. Hepatobiliary: No focal liver abnormality is seen. No gallstones, gallbladder wall thickening, or biliary dilatation. Pancreas: Unremarkable. No pancreatic ductal dilatation or surrounding inflammatory changes. Spleen: Normal in size without focal abnormality. Adrenals/Urinary Tract: The adrenal glands unremarkable. The kidneys, visualized ureters, and urinary bladder appear unremarkable. Stomach/Bowel: Mild sigmoid diverticulosis. There is no bowel obstruction or active inflammation. The appendix is normal. Vascular/Lymphatic: Mild aortoiliac atherosclerotic disease. The IVC is unremarkable. No portal venous gas. There is no adenopathy. Reproductive: The uterus is grossly unremarkable. No suspicious adnexal masses. Other: None Musculoskeletal: No acute or significant osseous findings. IMPRESSION: 1. No acute intrathoracic, abdominal, or pelvic pathology. 2. Mild sigmoid diverticulosis. No bowel obstruction. Normal appendix. 3. A 5 mm subpleural nodule in the right lower lobe. No follow-up needed if patient is low-risk.This recommendation follows the consensus statement: Guidelines for Management of Incidental Pulmonary Nodules Detected on CT Images: From the Fleischner Society 2017; Radiology 2017; 284:228-243. 4.  Aortic Atherosclerosis (ICD10-I70.0). Electronically Signed   By: Angus Bark M.D.   On: 10/26/2023 17:20   DG Chest Portable 1 View Result Date: 10/25/2023 CLINICAL DATA:  Fever.  Hypertension. EXAM: PORTABLE CHEST 1 VIEW COMPARISON:  08/14/2018 FINDINGS: The lungs are clear without focal pneumonia, edema, pneumothorax or pleural effusion. The cardiopericardial silhouette is within normal limits for size. No acute bony abnormality. IMPRESSION: No active disease.  Electronically Signed   By: Donnal Fusi M.D.   On: 10/25/2023 12:03    Microbiology: Results for orders placed or performed during the hospital encounter of 10/25/23  Blood culture (routine x 2)     Status: None   Collection Time: 10/25/23 11:14 AM   Specimen: BLOOD  Result Value Ref Range Status   Specimen Description BLOOD LEFT ANTECUBITAL  Final   Special Requests   Final    BOTTLES DRAWN AEROBIC AND ANAEROBIC Blood Culture adequate volume   Culture   Final    NO GROWTH 5 DAYS Performed at [facility], [address] [place], [place] [address]    Report Status 10/30/2023 FINAL  Final  Resp panel by RT-PCR (RSV, Flu A&B, Covid) Anterior Nasal Swab     Status: None   Collection Time: 10/25/23 12:20 PM   Specimen: Anterior Nasal Swab  Result Value Ref Range Status   SARS Coronavirus 2 by RT PCR NEGATIVE NEGATIVE Final    Comment: (NOTE) SARS-CoV-2 target nucleic acids are NOT DETECTED.  The SARS-CoV-2 RNA is generally detectable in upper respiratory specimens during the acute phase of infection. The lowest concentration of SARS-CoV-2 viral copies this assay can detect is 138 copies/mL. A negative result does not preclude SARS-Cov-2 infection and should not be used as the sole basis for treatment or other patient management decisions. A negative result may occur with  improper specimen collection/handling, submission of specimen other than nasopharyngeal swab, presence of viral mutation(s) within the areas targeted by this assay, and inadequate number of viral copies(<138 copies/mL). A negative result must be combined with clinical observations, patient history, and epidemiological information. The expected result is Negative.  Fact Sheet for Patients:  BloggerCourse.com  Fact Sheet for Healthcare Providers:  SeriousBroker.it  This test is no t yet approved or cleared by the [place]  FDA and  has been authorized for  detection and/or diagnosis of SARS-CoV-2 by FDA under an Emergency Use Authorization (EUA). This EUA will remain  in effect (meaning this test can be used) for the duration of the COVID-19 declaration under Section 564(b)(1) of the Act, 21 U.S.C.section 360bbb-3(b)(1), unless the authorization is terminated  or revoked sooner.       Influenza A by PCR NEGATIVE NEGATIVE Final   Influenza B by PCR NEGATIVE NEGATIVE Final    Comment: (NOTE) The Xpert Xpress SARS-CoV-2/FLU/RSV plus assay is intended as an aid in the diagnosis of influenza from Nasopharyngeal swab specimens and should not be used as a sole basis for treatment. Nasal washings and aspirates are unacceptable for Xpert Xpress SARS-CoV-2/FLU/RSV testing.  Fact Sheet for Patients: BloggerCourse.com  Fact Sheet for Healthcare Providers: SeriousBroker.it  This test is not yet approved or cleared by the [place]  FDA and has been authorized for detection and/or diagnosis of SARS-CoV-2 by FDA under an Emergency Use Authorization (EUA). This EUA will remain in effect (meaning this test can be used) for the duration of the COVID-19 declaration under Section 564(b)(1) of the Act, 21 U.S.C. section 360bbb-3(b)(1), unless the authorization is terminated or revoked.     Resp Syncytial Virus by PCR NEGATIVE NEGATIVE Final    Comment: (NOTE) Fact Sheet for Patients: BloggerCourse.com  Fact Sheet for Healthcare Providers: SeriousBroker.it  This  test is not yet approved or cleared by the [place]  FDA and has been authorized for detection and/or diagnosis of SARS-CoV-2 by FDA under an Emergency Use Authorization (EUA). This EUA will remain in effect (meaning this test can be used) for the duration of the COVID-19 declaration under Section 564(b)(1) of the Act, 21 U.S.C. section 360bbb-3(b)(1), unless the authorization is  terminated or revoked.  Performed at [facility], [address] [place], [place] [address]   Blood culture (routine x 2)     Status: Abnormal   Collection Time: 10/25/23 12:20 PM   Specimen: BLOOD  Result Value Ref Range Status   Specimen Description   Final    BLOOD BLOOD RIGHT ARM Performed at [facility], [address] [place], [place] [address]    Special Requests   Final    BOTTLES DRAWN AEROBIC ONLY Blood Culture results may not be optimal due to an inadequate volume of blood received in culture bottles Performed at [facility], [address], [place], [place] [address]    Culture  Setup Time   Final    AEROBIC BOTTLE ONLY GRAM POSITIVE COCCI Gram Stain Report Called to,Read Back By and Verified With: J KING AT 1147 10/26/23 BY A WILSON CRITICAL RESULT CALLED TO, READ BACK BY AND VERIFIED WITH: RN GEORGE ROOS ON 10/26/23 @ 1725 BY DRT    Culture (A)  Final    STAPHYLOCOCCUS CAPITIS STAPHYLOCOCCUS HOMINIS THE SIGNIFICANCE OF ISOLATING THIS ORGANISM FROM A SINGLE SET OF BLOOD CULTURES WHEN MULTIPLE SETS ARE DRAWN IS UNCERTAIN. PLEASE NOTIFY THE MICROBIOLOGY DEPARTMENT WITHIN ONE WEEK IF SPECIATION AND SENSITIVITIES ARE REQUIRED. Performed at [facility], [address] [place], [place] [address]    Report Status 10/28/2023 FINAL  Final  Blood Culture ID Panel (Reflexed)     Status: Abnormal   Collection Time: 10/25/23 12:20 PM  Result Value Ref Range Status   Enterococcus faecalis NOT DETECTED NOT DETECTED Final   Enterococcus Faecium NOT DETECTED NOT DETECTED Final   Listeria monocytogenes NOT DETECTED NOT DETECTED Final   Staphylococcus species DETECTED (A) NOT DETECTED Final    Comment: CRITICAL RESULT CALLED TO, READ BACK BY AND VERIFIED WITH: RN GEORGE ROOS ON 10/26/23 @ 1725 BY DRT    Staphylococcus aureus (BCID) NOT DETECTED NOT DETECTED Final   Staphylococcus epidermidis NOT DETECTED NOT DETECTED Final   Staphylococcus lugdunensis NOT DETECTED  NOT DETECTED Final   Streptococcus species NOT DETECTED NOT DETECTED Final   Streptococcus agalactiae NOT DETECTED NOT DETECTED Final   Streptococcus pneumoniae NOT DETECTED NOT DETECTED Final   Streptococcus pyogenes NOT DETECTED NOT DETECTED Final   A.calcoaceticus-baumannii NOT DETECTED NOT DETECTED Final   Bacteroides fragilis NOT DETECTED NOT DETECTED Final   Enterobacterales NOT DETECTED NOT DETECTED Final   Enterobacter cloacae complex NOT DETECTED NOT DETECTED Final   Escherichia coli NOT DETECTED NOT DETECTED Final   Klebsiella aerogenes NOT DETECTED NOT DETECTED Final   Klebsiella oxytoca NOT DETECTED NOT DETECTED Final   Klebsiella pneumoniae NOT DETECTED NOT DETECTED Final   Proteus species NOT DETECTED NOT DETECTED Final   Salmonella species NOT DETECTED NOT DETECTED Final   Serratia marcescens NOT DETECTED NOT DETECTED Final   Haemophilus influenzae NOT DETECTED NOT DETECTED Final   Neisseria meningitidis NOT DETECTED NOT DETECTED Final   Pseudomonas aeruginosa NOT DETECTED NOT DETECTED Final   Stenotrophomonas maltophilia NOT DETECTED NOT DETECTED Final   Candida albicans NOT DETECTED NOT DETECTED Final  Candida auris NOT DETECTED NOT DETECTED Final   Candida glabrata NOT DETECTED NOT DETECTED Final   Candida krusei NOT DETECTED NOT DETECTED Final   Candida parapsilosis NOT DETECTED NOT DETECTED Final   Candida tropicalis NOT DETECTED NOT DETECTED Final   Cryptococcus neoformans/gattii NOT DETECTED NOT DETECTED Final    Comment: Performed at [facility], [address] [place], [place] [address]  MRSA Next Gen by PCR, Nasal     Status: None   Collection Time: 10/25/23  7:15 PM   Specimen: Nasal Mucosa; Nasal Swab  Result Value Ref Range Status   MRSA by PCR Next Gen NOT DETECTED NOT DETECTED Final    Comment: (NOTE) The GeneXpert MRSA Assay (FDA approved for NASAL specimens only), is one component of a comprehensive MRSA colonization surveillance program.  It is not intended to diagnose MRSA infection nor to guide or monitor treatment for MRSA infections. Test performance is not FDA approved in patients less than 80 years old. Performed at [facility], [address], [place], [place] [address]   Respiratory (~20 pathogens) panel by PCR     Status: Abnormal   Collection Time: 10/26/23  8:59 AM   Specimen: Nasopharyngeal Swab; Respiratory  Result Value Ref Range Status   Adenovirus NOT DETECTED NOT DETECTED Final   Coronavirus 229E NOT DETECTED NOT DETECTED Final    Comment: (NOTE) The Coronavirus on the Respiratory Panel, DOES NOT test for the novel  Coronavirus (2019 nCoV)    Coronavirus HKU1 NOT DETECTED NOT DETECTED Final   Coronavirus NL63 NOT DETECTED NOT DETECTED Final   Coronavirus OC43 NOT DETECTED NOT DETECTED Final   Metapneumovirus NOT DETECTED NOT DETECTED Final   Rhinovirus / Enterovirus NOT DETECTED NOT DETECTED Final   Influenza A NOT DETECTED NOT DETECTED Final   Influenza B NOT DETECTED NOT DETECTED Final   Parainfluenza Virus 1 NOT DETECTED NOT DETECTED Final   Parainfluenza Virus 2 NOT DETECTED NOT DETECTED Final   Parainfluenza Virus 3 DETECTED (A) NOT DETECTED Final   Parainfluenza Virus 4 NOT DETECTED NOT DETECTED Final   Respiratory Syncytial Virus NOT DETECTED NOT DETECTED Final   Bordetella pertussis NOT DETECTED NOT DETECTED Final   Bordetella Parapertussis NOT DETECTED NOT DETECTED Final   Chlamydophila pneumoniae NOT DETECTED NOT DETECTED Final   Mycoplasma pneumoniae NOT DETECTED NOT DETECTED Final    Comment: Performed at [facility], [address] [place], [place] [address]  Culture, blood (Routine X 2) w Reflex to ID Panel     Status: None (Preliminary result)   Collection Time: 10/28/23 10:59 AM   Specimen: BLOOD  Result Value Ref Range Status   Specimen Description BLOOD RIGHT ANTECUBITAL  Final   Special Requests   Final    BOTTLES DRAWN AEROBIC AND ANAEROBIC Blood Culture adequate  volume   Culture   Final    NO GROWTH 2 DAYS Performed at [facility], [address] [place], [place] [address]    Report Status PENDING  Incomplete  Culture, blood (Routine X 2) w Reflex to ID Panel     Status: None (Preliminary result)   Collection Time: 10/28/23 10:59 AM   Specimen: BLOOD  Result Value Ref Range Status   Specimen Description BLOOD BLOOD RIGHT ARM  Final   Special Requests   Final    BOTTLES DRAWN AEROBIC AND ANAEROBIC Blood Culture adequate volume   Culture   Final    NO GROWTH 2 DAYS Performed at [facility], [address], [place], [place] [address]    Report Status PENDING  Incomplete    Labs: CBC: Recent Labs  Lab 10/25/23 1114 10/26/23 0455 10/28/23 0311 10/29/23 0501 10/30/23 0410  WBC 4.3 3.9* 3.9* 4.8 4.8  NEUTROABS 3.4  --   --  3.1  --   HGB 13.8 13.0 12.1 12.1 11.1*  HCT 42.3 39.1 36.6 36.8 33.3*  MCV 86.9 88.1 87.6 88.5 86.7  PLT 119* 112* 140* 191 194   Basic Metabolic Panel: Recent Labs  Lab 10/25/23 1114 10/26/23 0455 10/28/23 0311 10/29/23 0501 10/30/23 0410  NA 137 137 137 133* 134*  K 3.4* 4.0 3.3* 3.6 3.9  CL 100 106 107 102 104  CO2 25 25 21* 21* 20*  GLUCOSE 112* 122* 111* 98 107*  BUN 14 12 14 22  24*  CREATININE 0.81 0.74 0.71 1.06* 0.95  CALCIUM 8.8* 8.2* 8.3* 8.4* 8.1*   Liver Function Tests: Recent Labs  Lab 10/25/23 1114 10/29/23 0501 10/30/23 0410  AST 28 82* 69*  ALT 26 103* 95*  ALKPHOS 76 100 89  BILITOT 0.6 0.5 0.6  PROT 6.6 6.0* 5.4*  ALBUMIN 3.5 3.0* 2.7*   CBG: No results for input(s): "GLUCAP" in the last 168 hours.  Discharge time spent: greater than 30 minutes.  Signed: Demaris Fillers, MD [facility] 10/30/2023

## 2023-10-30 NOTE — Progress Notes (Signed)
 Reviewed discharge instructions with patient. IV's removed from left arm. No issues or concerns with discharge information. Family here to take patient home.

## 2023-10-30 NOTE — Evaluation (Signed)
 Physical Therapy Evaluation Patient Details Name: Miranda Decker MRN: [id number] DOB: [birth year] Today's Date: 10/30/2023  History of Present Illness  Miranda Decker is a 68 y.o. female with medical history significant for hypertension.  Patient presented to the ED with complaints of fevers of 5 days duration.  She reports fever up to 103.4 at home.  She reports generalized body aches.  She denies difficulty breathing, no cough, no vomiting no abdominal pain no diarrhea, no urinary symptoms.  No headaches, no chest pain, no change in vision.     Patient has not taking any antihypertensives in at least 3 years.  She was started on lisinopril years ago, but developed tinnitus which has persisted hence lisinopril was discontinued.  Then during COVID she had COVID infection, she reports she was started on 4 blood pressure medications at the time, reports dizziness, almost passing out and that she was unable to function- so she stopped taking them.  She does not remember the names.  Reports she checks her blood pressure at home, usually systolic is in the 150s and diastolic in the 80s.   Clinical Impression  Patient functioning near baseline for functional mobility and gait demonstrating good return for ambulating in room, hallways without loss of balance or need for an AD. Plan:  Patient discharged from physical therapy to care of nursing for ambulation daily as tolerated for length of stay.          If plan is discharge home, recommend the following: Help with stairs or ramp for entrance   Can travel by private vehicle        Equipment Recommendations None recommended by PT  Recommendations for Other Services       Functional Status Assessment Patient has had a recent decline in their functional status and demonstrates the ability to make significant improvements in function in a reasonable and predictable amount of time.     Precautions / Restrictions Precautions Precautions:  None Restrictions Weight Bearing Restrictions Per Provider Order: No      Mobility  Bed Mobility Overal bed mobility: Independent                  Transfers Overall transfer level: Modified independent                      Ambulation/Gait Ambulation/Gait assistance: Modified independent (Device/Increase time) Gait Distance (Feet): 100 Feet Assistive device: None Gait Pattern/deviations: WFL(Within Functional Limits) Gait velocity: decreased     General Gait Details: grossly WFL with good return for ambulating in room, hallways without loss of  balance or need for an AD  Stairs            Wheelchair Mobility     Tilt Bed    Modified Rankin (Stroke Patients Only)       Balance Overall balance assessment: No apparent balance deficits (not formally assessed)                                           Pertinent Vitals/Pain Pain Assessment Pain Assessment: No/denies pain    Home Living Family/patient expects to be discharged to:: Private residence Living Arrangements: Spouse/significant other Available Help at Discharge: Family;Available 24 hours/day Type of Home: House Home Access: Level entry;Stairs to enter Entrance Stairs-Rails: None Entrance Stairs-Number of Steps: 1   Home Layout: One level Home Equipment: Crutches  Prior Function Prior Level of Function : Independent/Modified Independent;Driving             Mobility Comments: Community ambulation without AD, drives, shops ADLs Comments: Independent     Extremity/Trunk Assessment   Upper Extremity Assessment Upper Extremity Assessment: Overall WFL for tasks assessed    Lower Extremity Assessment Lower Extremity Assessment: Overall WFL for tasks assessed    Cervical / Trunk Assessment Cervical / Trunk Assessment: Normal  Communication   Communication Communication: No apparent difficulties    Cognition Arousal: Alert Behavior During  Therapy: WFL for tasks assessed/performed   PT - Cognitive impairments: No apparent impairments                         Following commands: Intact       Cueing Cueing Techniques: Verbal cues     General Comments      Exercises     Assessment/Plan    PT Assessment Patient does not need any further PT services  PT Problem List         PT Treatment Interventions      PT Goals (Current goals can be found in the Care Plan section)  Acute Rehab PT Goals Patient Stated Goal: return home with family to assist PT Goal Formulation: With patient Time For Goal Achievement: 10/30/23 Potential to Achieve Goals: Good    Frequency       Co-evaluation               AM-PAC PT "6 Clicks" Mobility  Outcome Measure Help needed turning from your back to your side while in a flat bed without using bedrails?: None Help needed moving from lying on your back to sitting on the side of a flat bed without using bedrails?: None Help needed moving to and from a bed to a chair (including a wheelchair)?: None Help needed standing up from a chair using your arms (e.g., wheelchair or bedside chair)?: None Help needed to walk in hospital room?: None Help needed climbing 3-5 steps with a railing? : A Little 6 Click Score: 23    End of Session   Activity Tolerance: Patient tolerated treatment well;Patient limited by fatigue Patient left: in chair;with call bell/phone within reach Nurse Communication: Mobility status PT Visit Diagnosis: Unsteadiness on feet (R26.81);Other abnormalities of gait and mobility (R26.89);Muscle weakness (generalized) (M62.81)    Time: 1610-9604 PT Time Calculation (min) (ACUTE ONLY): 20 min   Charges:   PT Evaluation $PT Eval Moderate Complexity: 1 Mod PT Treatments $Therapeutic Activity: 8-22 mins PT General Charges $$ ACUTE PT VISIT: 1 Visit         12:02 PM, 10/30/23 Walton Guppy, MPT Physical Therapist with [facility] [phone] office [phone] mobile phone

## 2023-10-30 NOTE — Care Management Important Message (Signed)
 Important Message  Patient Details  Name: Miranda Decker MRN: [id number] Date of Birth: 1955/12/06   Important Message Given:  Yes - Medicare IM     Miranda Decker 10/30/2023, 11:48 AM

## 2023-10-31 LAB — HAPTOGLOBIN: Haptoglobin: 216 mg/dL (ref 37–355)

## 2023-10-31 LAB — ANA W/REFLEX IF POSITIVE: Anti Nuclear Antibody (ANA): NEGATIVE

## 2023-11-01 LAB — ANCA TITERS
Atypical P-ANCA titer: <1:20 {titer}
C-ANCA: <1:20 {titer}
P-ANCA: <1:20 {titer}

## 2023-11-01 LAB — ALDOSTERONE + RENIN ACTIVITY W/ RATIO
ALDO / PRA Ratio: 2.8 (ref 0.0–30.0)
Aldosterone: 1 ng/dL (ref 0.0–30.0)
PRA LC/MS/MS: 0.354 ng/mL/h (ref 0.167–5.380)

## 2023-11-02 LAB — CULTURE, BLOOD (ROUTINE X 2)
Special Requests: ADEQUATE
Special Requests: ADEQUATE

## 2023-11-09 NOTE — Progress Notes (Signed)
 Cardiology Office Note:    Date:  11/13/2023   ID:  Miranda Decker, DOB September 21, 1955, MRN [id number]  PCP:  Patient, No Pcp Per   [facility] Providers Cardiologist:  None     Referring MD: No ref. provider found   Chief Complaint  Patient presents with   Hypertension    History of Present Illness:    Miranda Decker is a 68 y.o. female is self referred for management of HTN. She was recently hospitalized with a febrile illness at the end of May. Tested positive for pyuria and parainfluenza. She was severely hypertensive on admission and was on no medication. Medications titrated during hospitalization including losartan , Coreg , amlodipine  and hydralazine . Reports history of tinnitus on lisinopril. CT did show some aortic atherosclerosis but no coronary calcification.   On follow up today she notes she still has tinnitus. Also feeling very weak and no energy. Notes numbness/tingling in her hands. Minimal swelling in feet. BP at home has been very good. She tries to eat healthy- low sodium. Prior to hospitalization she was very active but now feels too tired.   Past Medical History:  Diagnosis Date   Hypertension     History reviewed. No pertinent surgical history.  Current Medications: Current Meds  Medication Sig   amLODipine  (NORVASC ) 10 MG tablet Take 1 tablet (10 mg total) by mouth daily.   carvedilol  (COREG ) 6.25 MG tablet Take 1 tablet (6.25 mg total) by mouth 2 (two) times daily with a meal.   losartan  (COZAAR ) 50 MG tablet Take 1 tablet (50 mg total) by mouth daily.   vitamin B-12 (VITAMIN B12) 500 MCG tablet Take 1 tablet (500 mcg total) by mouth daily.   [DISCONTINUED] cephALEXin  (KEFLEX ) 500 MG capsule Take 1 capsule (500 mg total) by mouth every 8 (eight) hours.   [DISCONTINUED] hydrALAZINE  (APRESOLINE ) 100 MG tablet Take 1 tablet (100 mg total) by mouth every 8 (eight) hours.     Allergies:   Lisinopril and Other   Social History   Socioeconomic History    Marital status: Married    Spouse name: Not on file   Number of children: Not on file   Years of education: Not on file   Highest education level: Not on file  Occupational History   Not on file  Tobacco Use   Smoking status: Never   Smokeless tobacco: Not on file  Substance and Sexual Activity   Alcohol use: Never   Drug use: Never   Sexual activity: Not on file  Other Topics Concern   Not on file  Social History Narrative   Not on file   Social Drivers of Health   Financial Resource Strain: Not on file  Food Insecurity: No Food Insecurity (10/25/2023)   Hunger Vital Sign    Worried About Running Out of Food in the Last Year: Never true    Ran Out of Food in the Last Year: Never true  Transportation Needs: No Transportation Needs (10/25/2023)   PRAPARE - Administrator, Civil Service (Medical): No    Lack of Transportation (Non-Medical): No  Physical Activity: Not on file  Stress: Not on file  Social Connections: Socially Integrated (10/25/2023)   Social Connection and Isolation Panel    Frequency of Communication with Friends and Family: More than three times a week    Frequency of Social Gatherings with Friends and Family: Three times a week    Attends Religious Services: 1 to 4 times per year  Active Member of Clubs or Organizations: Yes    Attends Banker Meetings: 1 to 4 times per year    Marital Status: Married     Family History: The patient's family history is not on file.  ROS:   Please see the history of present illness.     All other systems reviewed and are negative.  EKGs/Labs/Other Studies Reviewed:    The following studies were reviewed today:       Recent Labs: 10/26/2023: TSH 0.375 10/30/2023: ALT 95; BUN 24; Creatinine, Ser 0.95; Hemoglobin 11.1; Platelets 194; Potassium 3.9; Sodium 134  Recent Lipid Panel No results found for: CHOL, TRIG, HDL, CHOLHDL, VLDL, LDLCALC, LDLDIRECT   Risk  Assessment/Calculations:                Physical Exam:    VS:  BP (!) 114/48 (BP Location: Left Arm, Patient Position: Sitting)   Pulse 70   Ht 5' 2 (1.575 m)   Wt 160 lb (72.6 kg)   SpO2 99%   BMI 29.26 kg/m     Wt Readings from Last 3 Encounters:  11/13/23 160 lb (72.6 kg)  10/30/23 167 lb 15.9 oz (76.2 kg)  08/14/18 160 lb (72.6 kg)     GEN:  Well nourished, well developed in no acute distress HEENT: Normal NECK: No JVD; No carotid bruits LYMPHATICS: No lymphadenopathy CARDIAC: RRR, no murmurs, rubs, gallops RESPIRATORY:  Clear to auscultation without rales, wheezing or rhonchi  ABDOMEN: Soft, non-tender, non-distended MUSCULOSKELETAL:  No edema; No deformity  SKIN: Warm and dry NEUROLOGIC:  Alert and oriented x 3 PSYCHIATRIC:  Normal affect   ASSESSMENT:    1. Primary hypertension   2. Aortic atherosclerosis (HCC)    PLAN:    In order of problems listed above:  HTN. BP now well controlled. Notes side effects of fatigue. Will reduce hydralazine  to 50 mg tid. May be able to reduce medication further if BP is still controlled. Will arrange follow up in HTN clinic. Renin/aldosterone levels ok in hospital. Will check renal duplex. Continue low sodium diet.  Aortic atherosclerosis. Will check CMET and lipids. If cholesterol not at goal will add statin.           Medication Adjustments/Labs and Tests Ordered: Current medicines are reviewed at length with the patient today.  Concerns regarding medicines are outlined above.  No orders of the defined types were placed in this encounter.  Meds ordered this encounter  Medications   hydrALAZINE  (APRESOLINE ) 100 MG tablet    Sig: Take 0.5 tablets (50 mg total) by mouth every 8 (eight) hours.    There are no Patient Instructions on file for this visit.   Signed, Wilkins Elpers [place], MD  11/13/2023 9:01 AM    [facility]

## 2023-11-13 ENCOUNTER — Encounter: Payer: Self-pay | Admitting: Cardiology

## 2023-11-13 ENCOUNTER — Ambulatory Visit: Attending: Cardiology | Admitting: Cardiology

## 2023-11-13 ENCOUNTER — Other Ambulatory Visit (HOSPITAL_COMMUNITY): Payer: Self-pay

## 2023-11-13 VITALS — BP 114/48 | HR 70 | Ht 62.0 in | Wt 160.0 lb

## 2023-11-13 DIAGNOSIS — I10 Essential (primary) hypertension: Secondary | ICD-10-CM

## 2023-11-13 DIAGNOSIS — I700 Atherosclerosis of aorta: Secondary | ICD-10-CM | POA: Diagnosis not present

## 2023-11-13 MED ORDER — HYDRALAZINE HCL 100 MG PO TABS: 50.0000 mg | ORAL_TABLET | Freq: Three times a day (TID) | ORAL | Status: DC

## 2023-11-13 NOTE — Patient Instructions (Signed)
 Medication Instructions:  Decrease Hydralazine  to 50 mg three times a day  Continue all other medications *If you need a refill on your cardiac medications before your next appointment, please call your pharmacy*  Lab Work: Cmet,lipid panel today  Testing/Procedures: Schedule Renal Duplex  first available  Follow-Up: At [facility], you and your health needs are our priority.  As part of our continuing mission to provide you with exceptional heart care, our providers are all part of one team.  This team includes your primary Cardiologist (physician) and Advanced Practice Providers or APPs (Physician Assistants and Nurse Practitioners) who all work together to provide you with the care you need, when you need it.  Your next appointment:  3 months 01/2024    Provider:  PA          Schedule appointment with Pharm D in Hypertension Clinic first available    We recommend signing up for the patient portal called MyChart.  Sign up information is provided on this After Visit Summary.  MyChart is used to connect with patients for Virtual Visits (Telemedicine).  Patients are able to view lab/test results, encounter notes, upcoming appointments, etc.  Non-urgent messages can be sent to your provider as well.   To learn more about what you can do with MyChart, go to [email or web address].

## 2023-11-14 ENCOUNTER — Ambulatory Visit: Payer: Self-pay | Admitting: Cardiology

## 2023-11-14 LAB — COMPREHENSIVE METABOLIC PANEL WITH GFR
ALT: 16 IU/L (ref 0–32)
AST: 16 IU/L (ref 0–40)
Albumin: 4.5 g/dL (ref 3.9–4.9)
Alkaline Phosphatase: 107 IU/L (ref 44–121)
BUN/Creatinine Ratio: 18 (ref 12–28)
BUN: 21 mg/dL (ref 8–27)
Bilirubin Total: 0.5 mg/dL (ref 0.0–1.2)
CO2: 18 mmol/L — ABNORMAL LOW (ref 20–29)
Calcium: 9.6 mg/dL (ref 8.7–10.3)
Chloride: 102 mmol/L (ref 96–106)
Creatinine, Ser: 1.18 mg/dL — ABNORMAL HIGH (ref 0.57–1.00)
Globulin, Total: 2.3 g/dL (ref 1.5–4.5)
Glucose: 90 mg/dL (ref 70–99)
Potassium: 4.7 mmol/L (ref 3.5–5.2)
Sodium: 137 mmol/L (ref 134–144)
Total Protein: 6.8 g/dL (ref 6.0–8.5)
eGFR: 51 mL/min/{1.73_m2} — ABNORMAL LOW (ref >59)

## 2023-11-14 LAB — LIPID PANEL
Chol/HDL Ratio: 6.2 ratio — ABNORMAL HIGH (ref 0.0–4.4)
Cholesterol, Total: 218 mg/dL — ABNORMAL HIGH (ref 100–199)
HDL: 35 mg/dL — ABNORMAL LOW (ref >39)
LDL Chol Calc (NIH): 158 mg/dL — ABNORMAL HIGH (ref 0–99)
Triglycerides: 137 mg/dL (ref 0–149)
VLDL Cholesterol Cal: 25 mg/dL (ref 5–40)

## 2023-11-17 ENCOUNTER — Other Ambulatory Visit: Payer: Self-pay

## 2023-11-17 DIAGNOSIS — I700 Atherosclerosis of aorta: Secondary | ICD-10-CM

## 2023-11-17 MED ORDER — ROSUVASTATIN CALCIUM 20 MG PO TABS: 20.0000 mg | ORAL_TABLET | Freq: Every day | ORAL | 3 refills | Status: DC

## 2023-12-18 ENCOUNTER — Encounter (HOSPITAL_COMMUNITY)

## 2023-12-25 ENCOUNTER — Ambulatory Visit
Admission: RE | Admit: 2023-12-25 | Discharge: 2023-12-25 | Disposition: A | Discharge status: Home / Self Care | Source: Ambulatory Visit | Attending: Cardiology | Admitting: Cardiology

## 2023-12-25 ENCOUNTER — Encounter (HOSPITAL_COMMUNITY)

## 2023-12-25 ENCOUNTER — Ambulatory Visit: Admitting: Pharmacist

## 2023-12-25 ENCOUNTER — Encounter: Payer: Self-pay | Admitting: Pharmacist

## 2023-12-25 VITALS — BP 116/64 | HR 60

## 2023-12-25 DIAGNOSIS — I10 Essential (primary) hypertension: Secondary | ICD-10-CM | POA: Insufficient documentation

## 2023-12-25 DIAGNOSIS — E785 Hyperlipidemia, unspecified: Secondary | ICD-10-CM | POA: Insufficient documentation

## 2023-12-25 DIAGNOSIS — I959 Hypotension, unspecified: Secondary | ICD-10-CM | POA: Insufficient documentation

## 2023-12-25 DIAGNOSIS — I700 Atherosclerosis of aorta: Secondary | ICD-10-CM | POA: Diagnosis not present

## 2023-12-25 MED ORDER — LOSARTAN POTASSIUM 50 MG PO TABS: 50.0000 mg | ORAL_TABLET | Freq: Every day | ORAL | 3 refills | Status: DC

## 2023-12-25 MED ORDER — AMLODIPINE BESYLATE 10 MG PO TABS: 10.0000 mg | ORAL_TABLET | Freq: Every day | ORAL | 3 refills | Status: DC

## 2023-12-25 NOTE — Assessment & Plan Note (Addendum)
 Assessment: Blood pressure 116/64 in clinic today Patient reporting significant fatigue and dizziness and lightheadedness-some improvement after stopping hydralazine  Stopped hydralazine  due to swelling, numbness and dizziness She is still taking amlodipine , losartan  and carvedilol  I do not see a compelling indication for carvedilol   Plan: Decrease carvedilol  to 3.125 twice a day for 4 days then stop Continue losartan  50 mg daily and amlodipine  10 mg daily Follow-up in a week and a half Continue to monitor blood pressure at home

## 2023-12-25 NOTE — Assessment & Plan Note (Signed)
 Assessment: Patient started on rosuvastatin  20 mg daily due to LDL-C 158 Patient stopped rosuvastatin  due to muscle pains  Plan: Will discuss with patient at next visit potentially a retrial at a low-dose

## 2023-12-25 NOTE — Patient Instructions (Addendum)
 Your blood pressure goal is < 130/31mmHg   Decrease carvedilol  to 3.125 (1/2 tablet) by mouth twice a day for 4 days and then stop. Continue amlodipine  10mg  daily and losartan  50mg  daily Continue to monitor blood pressure Please bring on your home blood pressure cuff Please call me at [phone] with any questions or problems   Important lifestyle changes to control high blood pressure  Intervention  Effect on the BP   Weight loss Weight loss is one of the most effective lifestyle changes for controlling blood pressure. If you're overweight or obese, losing even a small amount of weight can help reduce blood pressure.    Blood pressure can decrease by 1 millimeter of mercury (mmHg) with each kilogram (about 2.2 pounds) of weight lost.   Exercise regularly As a general goal, aim for 30 minutes of moderate physical activity every day.    Regular physical activity can lower blood pressure by 5 - 8 mmHg.   Eat a healthy diet Eat a diet rich in whole grains, fruits, vegetables, lean meat, and low-fat dairy products. Limit processed foods, saturated fat, and sweets.    A heart-healthy diet can lower high blood pressure by 10 mmHg.   Reduce salt (sodium) in your diet Aim for 000mg  of sodium each day. Avoid deli meats, canned food, and frozen microwave meals which are high in sodium.     Limiting sodium can reduce blood pressure by 5 mmHg.   Limit alcohol One drink equals 12 ounces of beer, 5 ounces of wine, or 1.5 ounces of 80-proof liquor.    Limiting alcohol to < 1 drink a day for women or < 2 drinks a day for men can help lower blood pressure by about 4 mmHg.   To check your pressure at home you will need to:   Sit up in a chair, with feet flat on the floor and back supported. Do not cross your ankles or legs. Rest your left arm so that the cuff is about heart level. If the cuff goes on your upper arm, then just relax your arm on the table, arm of the chair, or your lap.  If you have a wrist cuff, hold your wrist against your chest at heart level. Place the cuff snugly around your arm, about 1 inch above the crease of your elbow. The cords should be inside the groove of your elbow.  Sit quietly, with the cuff in place, for about 5 minutes. Then press the power button to start a reading. Do not talk or move while the reading is taking place.  Record your readings on a sheet of paper. Although most cuffs have a memory, it is often easier to see a pattern developing when the numbers are all in front of you.  You can repeat the reading after 1-3 minutes if it is recommended.   Make sure your bladder is empty and you have not had caffeine or tobacco within the last 30 minutes   Always bring your blood pressure log with you to your appointments. If you have not brought your monitor in to be double checked for accuracy, please bring it to your next appointment.   You can find a list of validated (accurate) blood pressure cuffs at: validatebp.org

## 2023-12-25 NOTE — Progress Notes (Signed)
 Patient ID: [name]                 DOB: 07-16-1955                      MRN: [id number]      HPI: Miranda Decker is a 68 y.o. female referred by Dr. [place] to HTN clinic. PMH is significant for HTN, HLD. She was recently hospitalized with a febrile illness at the end of May. Tested positive for pyuria and parainfluenza. She was severely hypertensive on admission and was on no medication. Medications titrated during hospitalization including losartan , Coreg , amlodipine  and hydralazine . She was seen by Dr. [place] for follow up 11/13/23. Reported feeling weak and ringing in her ears. Hydralazine  was decreased to 50mg  TID.   Patient presents today for initial appointment with Pharm.D.  She reports that she stopped taking her hydralazine  altogether.  Reports home blood pressure 120-125/75 HR 60's.  She still feels dizzy like she is going to pass out.  Also extremely fatigued.  Spent almost all last month in bed.  When she tries to get out of bed and do things she gets so dizzy and fatigued that she has to lie down.  Would have to lie down several times in order to make a meal.  She also reports that she stopped taking rosuvastatin  due to muscle aches. She has a wrist cuff and an upper arm cuff but recently upper arm cuff ran out of batteries.  Her technique for wrist cuff does sound correct. Blood pressure in clinic initially 140 but dropped to 116/64.  Current HTN meds: amlodipine  10mg  daily, carvedilol  6.25mg  twice a day, losartan  50mg  daily Previously tried: lisinopril (tinnitus), hydralazine  (swelling, numbness) BP goal: <130/80  Family History: No family history on file.  Social History:  Social History   Socioeconomic History   Marital status: Married    Spouse name: Not on file   Number of children: Not on file   Years of education: Not on file   Highest education level: Not on file  Occupational History   Not on file  Tobacco Use   Smoking status: Never   Smokeless tobacco: Not on  file  Substance and Sexual Activity   Alcohol use: Never   Drug use: Never   Sexual activity: Not on file  Other Topics Concern   Not on file  Social History Narrative   Not on file   Social Drivers of Health   Financial Resource Strain: Not on file  Food Insecurity: No Food Insecurity (10/25/2023)   Hunger Vital Sign    Worried About Running Out of Food in the Last Year: Never true    Ran Out of Food in the Last Year: Never true  Transportation Needs: No Transportation Needs (10/25/2023)   PRAPARE - Administrator, Civil Service (Medical): No    Lack of Transportation (Non-Medical): No  Physical Activity: Not on file  Stress: Not on file  Social Connections: Socially Integrated (10/25/2023)   Social Connection and Isolation Panel    Frequency of Communication with Friends and Family: More than three times a week    Frequency of Social Gatherings with Friends and Family: Three times a week    Attends Religious Services: 1 to 4 times per year    Active Member of Clubs or Organizations: Yes    Attends Banker Meetings: 1 to 4 times per year    Marital Status: Married  Intimate Partner Violence: Not At Risk (10/25/2023)   Humiliation, Afraid, Rape, and Kick questionnaire    Fear of Current or Ex-Partner: No    Emotionally Abused: No    Physically Abused: No    Sexually Abused: No     Home BP readings: 120-125/75 HR 60's.   Wt Readings from Last 3 Encounters:  11/13/23 160 lb (72.6 kg)  10/30/23 167 lb 15.9 oz (76.2 kg)  08/14/18 160 lb (72.6 kg)   BP Readings from Last 3 Encounters:  12/25/23 116/64  11/13/23 (!) 114/48  10/30/23 (!) 157/65   Pulse Readings from Last 3 Encounters:  12/25/23 60  11/13/23 70  10/30/23 79    Renal function: CrCl cannot be calculated (Patient's most recent lab result is older than the maximum 21 days allowed.).  Past Medical History:  Diagnosis Date   Hypertension     Current Outpatient Medications on  File Prior to Visit  Medication Sig Dispense Refill   vitamin B-12 (VITAMIN B12) 500 MCG tablet Take 1 tablet (500 mcg total) by mouth daily.     No current facility-administered medications on file prior to visit.    Allergies  Allergen Reactions   Lisinopril Other (See Comments)    Passes out    Other Other (See Comments)    4 other bp medications Vertigo, off balance, dizziness, disoriented    Blood pressure 116/64, pulse 60.   Assessment/Plan:     1. Hypertension -  Hypotension Assessment: Blood pressure 116/64 in clinic today Patient reporting significant fatigue and dizziness and lightheadedness-some improvement after stopping hydralazine  Stopped hydralazine  due to swelling, numbness and dizziness She is still taking amlodipine , losartan  and carvedilol  I do not see a compelling indication for carvedilol   Plan: Decrease carvedilol  to 3.125 twice a day for 4 days then stop Continue losartan  50 mg daily and amlodipine  10 mg daily Follow-up in a week and a half Continue to monitor blood pressure at home    HLD (hyperlipidemia) Assessment: Patient started on rosuvastatin  20 mg daily due to LDL-C 158 Patient stopped rosuvastatin  due to muscle pains  Plan: Will discuss with patient at next visit potentially a retrial at a low-dose  Thank you  Menelik Mcfarren D Broc Caspers, Pharm.JONETTA SARAN, CPP [facility] A Division of [facility] [address] [place], [place] [address]  Phone: [phone]; Fax: [phone]

## 2024-01-03 ENCOUNTER — Ambulatory Visit: Attending: Cardiology | Admitting: Pharmacist

## 2024-01-03 VITALS — BP 152/68 | HR 73

## 2024-01-03 DIAGNOSIS — I10 Essential (primary) hypertension: Secondary | ICD-10-CM | POA: Insufficient documentation

## 2024-01-03 NOTE — Progress Notes (Signed)
 Patient ID: Miranda Decker                 DOB: 31-Dec-1955                      MRN: [id number]      HPI: Miranda Decker is a 68 y.o. female referred by Dr. Swaziland to HTN clinic. PMH is significant for HTN, HLD. She was recently hospitalized with a febrile illness at the end of May. Tested positive for pyuria and parainfluenza. She was severely hypertensive on admission and was on no medication. Medications titrated during hospitalization including losartan , Coreg , amlodipine  and hydralazine . She was seen by Dr. Swaziland for follow up 11/13/23. Reported feeling weak and ringing in her ears. Hydralazine  was decreased to 50mg  TID.   I saw patient 12/25/23 in clinic.  She had stopped her hydralazine  altogether.  Blood pressures at home were 120-125/75 with heart rates in the 60s.  She was still feeling dizzy like she was going to pass out and extremely fatigued.  She also reports stopping her rosuvastatin  due to muscle aches.  I decreased her carvedilol  to 3.125 mg twice a day for 4 days and then asked her to stop taking altogether.  She was to continue losartan  and amlodipine .  Patient presents today for follow-up.  She reports feeling better. Feels about 75 to 80%.  She brings in her home wrist cuff but it was not accurate.  She will pick up an upper arm cuff today from the pharmacy downstairs.  She had 1 episode of dizziness while she was lying down but it went away quickly.  Also had 1 episode of swelling that resolved on its own.  Denies any headaches.  Current HTN meds: amlodipine  10mg  daily, losartan  50mg  daily Previously tried: lisinopril (tinnitus), hydralazine  (swelling, numbness), carvedilol  (fatigue) BP goal: <130/80  Family History: No family history on file.  Social History:  Social History   Socioeconomic History   Marital status: Married    Spouse name: Not on file   Number of children: Not on file   Years of education: Not on file   Highest education level: Not on file  Occupational  History   Not on file  Tobacco Use   Smoking status: Never   Smokeless tobacco: Not on file  Substance and Sexual Activity   Alcohol use: Never   Drug use: Never   Sexual activity: Not on file  Other Topics Concern   Not on file  Social History Narrative   Not on file   Social Drivers of Health   Financial Resource Strain: Not on file  Food Insecurity: No Food Insecurity (10/25/2023)   Hunger Vital Sign    Worried About Running Out of Food in the Last Year: Never true    Ran Out of Food in the Last Year: Never true  Transportation Needs: No Transportation Needs (10/25/2023)   PRAPARE - Administrator, Civil Service (Medical): No    Lack of Transportation (Non-Medical): No  Physical Activity: Not on file  Stress: Not on file  Social Connections: Socially Integrated (10/25/2023)   Social Connection and Isolation Panel    Frequency of Communication with Friends and Family: More than three times a week    Frequency of Social Gatherings with Friends and Family: Three times a week    Attends Religious Services: 1 to 4 times per year    Active Member of Clubs or Organizations: Yes    Attends  Club or Organization Meetings: 1 to 4 times per year    Marital Status: Married  Catering manager Violence: Not At Risk (10/25/2023)   Humiliation, Afraid, Rape, and Kick questionnaire    Fear of Current or Ex-Partner: No    Emotionally Abused: No    Physically Abused: No    Sexually Abused: No     Home BP readings: 129/79 130/85 (highest)    Wt Readings from Last 3 Encounters:  11/13/23 160 lb (72.6 kg)  10/30/23 167 lb 15.9 oz (76.2 kg)  08/14/18 160 lb (72.6 kg)   BP Readings from Last 3 Encounters:  01/03/24 (!) 152/68  12/25/23 116/64  11/13/23 (!) 114/48   Pulse Readings from Last 3 Encounters:  01/03/24 73  12/25/23 60  11/13/23 70    Renal function: CrCl cannot be calculated (Patient's most recent lab result is older than the maximum 21 days  allowed.).  Past Medical History:  Diagnosis Date   Hypertension     Current Outpatient Medications on File Prior to Visit  Medication Sig Dispense Refill   amLODipine  (NORVASC ) 10 MG tablet Take 1 tablet (10 mg total) by mouth daily. 90 tablet 3   losartan  (COZAAR ) 50 MG tablet Take 1 tablet (50 mg total) by mouth daily. 90 tablet 3   vitamin B-12 (VITAMIN B12) 500 MCG tablet Take 1 tablet (500 mcg total) by mouth daily.     No current facility-administered medications on file prior to visit.    Allergies  Allergen Reactions   Lisinopril Other (See Comments)    Passes out    Other Other (See Comments)    4 other bp medications Vertigo, off balance, dizziness, disoriented    Blood pressure (!) 152/68, pulse 73.   Assessment/Plan: HYPERTENSION CONTROL Vitals:   01/03/24 0806 01/03/24 0807  BP: (!) 140/70 (!) 152/68    The patient's blood pressure is elevated above target today.  In order to address the patient's elevated BP: Blood pressure will be monitored at home to determine if medication changes need to be made.      1. Hypertension -  Hypertension Assessment: Patient fatigue has improved off of carvedilol .  She states she feels about 75 to 80% Her blood pressure in clinic today was mildly elevated Her home wrist cuff does not appear to be accurate.  She will pick up a new upper arm cuff today Appears to be tolerating her amlodipine  and losartan  well Reports that her tinnitus is worse.  Recommended she see an audiologist  Plan: Continue amlodipine  10 mg daily and losartan  50 mg daily Patient is to pick up a new blood pressure cuff and start monitoring blood pressure at home She will send me readings in 2 weeks, will consider adjustment at that time if home blood pressure readings are elevated She will follow-up with me in person on September 16   Thank you  Gaia Gullikson D Vyncent Overby, Pharm.Miranda Decker, CPP [facility] [address], [place], [place] [address]  Phone: [phone]; Fax: [phone]

## 2024-01-03 NOTE — Assessment & Plan Note (Signed)
 Assessment: Patient fatigue has improved off of carvedilol .  She states she feels about 75 to 80% Her blood pressure in clinic today was mildly elevated Her home wrist cuff does not appear to be accurate.  She will pick up a new upper arm cuff today Appears to be tolerating her amlodipine  and losartan  well Reports that her tinnitus is worse.  Recommended she see an audiologist  Plan: Continue amlodipine  10 mg daily and losartan  50 mg daily Patient is to pick up a new blood pressure cuff and start monitoring blood pressure at home She will send me readings in 2 weeks, will consider adjustment at that time if home blood pressure readings are elevated She will follow-up with me in person on September 16

## 2024-01-03 NOTE — Patient Instructions (Addendum)
 Your blood pressure goal is < 130/71mmHg   Please pick up an OMRON blood pressure machine Please start checking blood pressure twice daily Continue amlodipine  10mg  daily, losartan  50mg  daily Please call me with blood pressure readings in 2 weeks [phone]  Important lifestyle changes to control high blood pressure  Intervention  Effect on the BP   Weight loss Weight loss is one of the most effective lifestyle changes for controlling blood pressure. If you're overweight or obese, losing even a small amount of weight can help reduce blood pressure.    Blood pressure can decrease by 1 millimeter of mercury (mmHg) with each kilogram (about 2.2 pounds) of weight lost.   Exercise regularly As a general goal, aim for 30 minutes of moderate physical activity every day.    Regular physical activity can lower blood pressure by 5 - 8 mmHg.   Eat a healthy diet Eat a diet rich in whole grains, fruits, vegetables, lean meat, and low-fat dairy products. Limit processed foods, saturated fat, and sweets.    A heart-healthy diet can lower high blood pressure by 10 mmHg.   Reduce salt (sodium) in your diet Aim for 000mg  of sodium each day. Avoid deli meats, canned food, and frozen microwave meals which are high in sodium.     Limiting sodium can reduce blood pressure by 5 mmHg.   Limit alcohol One drink equals 12 ounces of beer, 5 ounces of wine, or 1.5 ounces of 80-proof liquor.    Limiting alcohol to < 1 drink a day for women or < 2 drinks a day for men can help lower blood pressure by about 4 mmHg.   To check your pressure at home you will need to:   Sit up in a chair, with feet flat on the floor and back supported. Do not cross your ankles or legs. Rest your left arm so that the cuff is about heart level. If the cuff goes on your upper arm, then just relax your arm on the table, arm of the chair, or your lap. If you have a wrist cuff, hold your wrist against your chest at heart  level. Place the cuff snugly around your arm, about 1 inch above the crease of your elbow. The cords should be inside the groove of your elbow.  Sit quietly, with the cuff in place, for about 5 minutes. Then press the power button to start a reading. Do not talk or move while the reading is taking place.  Record your readings on a sheet of paper. Although most cuffs have a memory, it is often easier to see a pattern developing when the numbers are all in front of you.  You can repeat the reading after 1-3 minutes if it is recommended.   Make sure your bladder is empty and you have not had caffeine or tobacco within the last 30 minutes   Always bring your blood pressure log with you to your appointments. If you have not brought your monitor in to be double checked for accuracy, please bring it to your next appointment.   You can find a list of validated (accurate) blood pressure cuffs at: validatebp.org

## 2024-01-17 ENCOUNTER — Telehealth: Payer: Self-pay | Admitting: Pharmacist

## 2024-01-17 NOTE — Telephone Encounter (Signed)
 Patient picked up new blood pressure cuff.  Called me with updated blood pressure readings.  She still continues to feel a little bit better.  Still some fatigue but it is improved.  Overall blood pressure readings are fairly well-controlled.  Will continue amlodipine  10 mg daily and losartan  50 mg daily and follow-up as planned in September.  Asked patient to bring in her new blood pressure cuff to that appointment.  8/6 125/75 65 8/7 122/73 64 138/81 hr 66 8/8 103/63 hr 61 130/81 hr 62 8/9 137/79 122/74 130/82 125/73 131/76 74 134/85 67 134/70 73 132/81 hr 67 123/80 65 130/80 69 137/76 76 138/75 74 123/70 66 134/85 63 120/76 66 131/75 77 127/72 60 125/76 135/79 63 130/80 124/76 129/77 64

## 2024-02-12 NOTE — Progress Notes (Signed)
  Cardiology Office Note:  .   Date:  02/19/2024  ID:  Miranda Decker, DOB 06-28-1955, MRN [id number] PCP: Patient, No Pcp Per  [facility] Providers Cardiologist:  Peter Swaziland, MD    History of Present Illness: .   Miranda Decker is a 68 y.o. female with history of HTN, HLD and aortic atherosclerosis.  Patient saw pharmacy HTN clinic last week. Had a lot of leg swelling and held amlodipine  for several days and then decreased to 5 mg. Still some swelling but not as bad. Didn't tolerate hydralazine -swelling/numbness. Carvedilol  caused fatigue. 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 Under a lot of stress daily. Watches salt closely.  ROS:    Studies Reviewed: SABRA         Prior CV Studies:     Renal US  11/2023 Summary:  Renal:    Right: Normal size right kidney. Normal right Resisitive Index. No         evidence of right renal artery stenosis. RRV flow present.  Left:  Normal size of left kidney. Normal left Resistive Index. No         evidence of left renal artery stenosis. LRV flow present.    *See table(s) above for measurements and observations.    Diagnosing physician: Deatrice Cage MD    Electronically signed by Deatrice Cage MD on 12/26/2023 at 8:04:33 AM.    Risk Assessment/Calculations:     HYPERTENSION CONTROL Vitals:   02/19/24 0934 02/19/24 1020  BP: (!) 168/66 (!) 170/70    The patient's blood pressure is elevated above target today.  In order to address the patient's elevated BP: Blood pressure will be monitored at home to determine if medication changes need to be made.; A current anti-hypertensive medication was adjusted today.; A new medication was prescribed today.; Follow up with general cardiology has been recommended.          Physical Exam:   VS:  BP (!) 170/70   Pulse 76   Ht 5' 2 (1.575 m)   Wt 156 lb (70.8 kg)   SpO2 96%   BMI 28.53 kg/m    Orhtostatics: No data found. Wt Readings from Last 3  Encounters:  02/19/24 156 lb (70.8 kg)  11/13/23 160 lb (72.6 kg)  10/30/23 167 lb 15.9 oz (76.2 kg)    GEN: Well nourished, well developed in no acute distress NECK: No JVD; No carotid bruits CARDIAC: RRR, no murmurs, rubs, gallops RESPIRATORY:  Clear to auscultation without rales, wheezing or rhonchi  ABDOMEN: Soft, non-tender, non-distended EXTREMITIES:  trace edema; No deformity   ASSESSMENT AND PLAN: .     HTN-BP running high on lower dose amlodipine . Hasn't tolerated hydralazine  or coreg . Will increase losartan  100 mg daily. Check Bmet in 2 weeks. Check echo. Low salt diet. F/u in [place] office-she lives in [place].   HLD-LDL cholesterol 158 hasn't tolerated statins. Trying to get down with diet before trying something else.   Aortic atherosclerosis-risk reduction        Dispo: f/u in [place] office in 2-3 weeks.   Signed, Olivia Pavy, PA-C

## 2024-02-13 ENCOUNTER — Ambulatory Visit: Attending: Cardiology | Admitting: Pharmacist

## 2024-02-13 VITALS — BP 146/70 | HR 71

## 2024-02-13 DIAGNOSIS — I10 Essential (primary) hypertension: Secondary | ICD-10-CM | POA: Diagnosis not present

## 2024-02-13 NOTE — Assessment & Plan Note (Addendum)
 Assessment: BP is not currently at goal <130/80 BP in clinic 144/68 on amlodipine  10 mg and losartan  50 mg Reports of swelling recently likely related to amlodipine .  Patient to hold amlodipine  for a few days to see if swelling goes down. Restart at a lower dose if swelling improves Recommended patient reach out to Melissa by Friday if swelling does not improve. Follow up will be decided based on swelling improvement Patient using BP monitor to measure at home. Patient encouraged to continue checking two times a day with proper technique. Record home BP on BP log to bring into next appointment.  Plan: Hold amlodipine  for a few days. If swelling improves, restart amlodipine  at 5 mg daily. Reach out to [facility] on Friday if swelling does not improve Continue losartan  50 mg Take BP at home with monitor and write down BP Follow up later this week about swelling. Follow up appointment will be decided then

## 2024-02-13 NOTE — Progress Notes (Signed)
 Patient ID: Miranda Decker                 DOB: 1955/08/19                      MRN: [id number]      HPI: Miranda Decker is a 68 y.o. female referred by Dr. Swaziland to HTN clinic. PMH is significant for HTN, HLD. She was recently hospitalized with a febrile illness at the end of May. Tested positive for pyuria and parainfluenza. She was severely hypertensive on admission and was on no medication. Medications titrated during hospitalization including losartan , Coreg , amlodipine  and hydralazine . She was seen by Dr. Swaziland for follow up 11/13/23. Reported feeling weak and ringing in her ears. Hydralazine  was decreased to 50mg  TID.   PharmD saw patient 12/25/23 in clinic. Home readings were 120-125/75 with HR in 60s. She reported feeling dizzy and fatigued. Patient's hydralazine  and carvedilol  were discontinued. She was to continue losartan  and amlodipine . On 01/03/24 PharmD follow up, patient was encouraged to pick up a upper arm BP cuff. Reported an episode of dizziness and swelling. Both resolved on their own. All medications stayed the same.  Patient presents today for follow-up. She states that she is doing well. She just came back from a beach trip with her family. She confirms her blood pressure medications.  She picked up an upper arm BP cuff to use at home. She will typically check her BP in the morning after she takes her meds. Brought BP cuff in to clinic today to review readings. Some readings are not accurate as she let her brother use the cuff while at the beach. Home readings avg 120s-130s/70s-80s. Her BP this morning was 113/65.  Reports swelling in her ankles and tingling in her hands at times. She notices that the swelling goes away when she elevates her feet and at night.   She reports feeling at a 75%. She does not feel as fatigued as she has previously. She experienced an episode at the beach where she felt like she was going to pass out. The feeling went away when she was able to sit down. She  has been having pains in her lower back and behind her neck when she feels fatigued.   Checked BP in clinic manually and with her home monitor. Clinic BP 144/68 and home monitor 142/76.  Discussed holding amlodipine  to monitor swelling. If swelling improves, amlodipine  can be restarted at lower dose. No other changes to be made.  Current HTN meds: amlodipine  10mg  daily, losartan  50mg  daily Previously tried: lisinopril (tinnitus), hydralazine  (swelling, numbness), carvedilol  (fatigue) BP goal: <130/80  Diet: Starting to eat heathy: 3 meals a day with snacks Veggies, fruits, chicken (baked, pan cooked in olive oil), fish Drink 8 glasses of water, 1 decaf in the morning, no sodas, no teas just green tea   Exercise: Walking in the morning as much as she can. Will stretch her neck and rolling  Family History: No family history on file.  Social History:  Social History   Socioeconomic History   Marital status: Married    Spouse name: Not on file   Number of children: Not on file   Years of education: Not on file   Highest education level: Not on file  Occupational History   Not on file  Tobacco Use   Smoking status: Never   Smokeless tobacco: Not on file  Substance and Sexual Activity   Alcohol use: Never  Drug use: Never   Sexual activity: Not on file  Other Topics Concern   Not on file  Social History Narrative   Not on file   Social Drivers of Health   Financial Resource Strain: Not on file  Food Insecurity: No Food Insecurity (10/25/2023)   Hunger Vital Sign    Worried About Running Out of Food in the Last Year: Never true    Ran Out of Food in the Last Year: Never true  Transportation Needs: No Transportation Needs (10/25/2023)   PRAPARE - Administrator, Civil Service (Medical): No    Lack of Transportation (Non-Medical): No  Physical Activity: Not on file  Stress: Not on file  Social Connections: Socially Integrated (10/25/2023)   Social Connection and  Isolation Panel    Frequency of Communication with Friends and Family: More than three times a week    Frequency of Social Gatherings with Friends and Family: Three times a week    Attends Religious Services: 1 to 4 times per year    Active Member of Clubs or Organizations: Yes    Attends Banker Meetings: 1 to 4 times per year    Marital Status: Married  Catering manager Violence: Not At Risk (10/25/2023)   Humiliation, Afraid, Rape, and Kick questionnaire    Fear of Current or Ex-Partner: No    Emotionally Abused: No    Physically Abused: No    Sexually Abused: No     Home BP readings:    Wt Readings from Last 3 Encounters:  11/13/23 160 lb (72.6 kg)  10/30/23 167 lb 15.9 oz (76.2 kg)  08/14/18 160 lb (72.6 kg)   BP Readings from Last 3 Encounters:  02/13/24 (!) 146/70  01/03/24 (!) 152/68  12/25/23 116/64   Pulse Readings from Last 3 Encounters:  02/13/24 71  01/03/24 73  12/25/23 60    Renal function: CrCl cannot be calculated (Patient's most recent lab result is older than the maximum 21 days allowed.).  Past Medical History:  Diagnosis Date   Hypertension     Current Outpatient Medications on File Prior to Visit  Medication Sig Dispense Refill   amLODipine  (NORVASC ) 10 MG tablet Take 1 tablet (10 mg total) by mouth daily. 90 tablet 3   losartan  (COZAAR ) 50 MG tablet Take 1 tablet (50 mg total) by mouth daily. 90 tablet 3   vitamin B-12 (VITAMIN B12) 500 MCG tablet Take 1 tablet (500 mcg total) by mouth daily.     No current facility-administered medications on file prior to visit.    Allergies  Allergen Reactions   Lisinopril Other (See Comments)    Passes out    Other Other (See Comments)    4 other bp medications Vertigo, off balance, dizziness, disoriented    Blood pressure (!) 146/70, pulse 71.   Assessment/Plan: HYPERTENSION CONTROL Vitals:   02/13/24 1351 02/13/24 1354  BP: (!) 144/68 (!) 146/70    The patient's blood  pressure is elevated above target today.  In order to address the patient's elevated BP: Blood pressure will be monitored at home to determine if medication changes need to be made.      1. Hypertension -  Hypertension Assessment: BP is not currently at goal <130/80 BP in clinic 144/68 on amlodipine  10 mg and losartan  50 mg Reports of swelling recently likely related to amlodipine .  Patient to hold amlodipine  for a few days to see if swelling goes down. Restart at a lower  dose if swelling improves Recommended patient reach out to Alycen Mack by Friday if swelling does not improve. Follow up will be decided based on swelling improvement Patient using BP monitor to measure at home. Patient encouraged to continue checking two times a day with proper technique. Record home BP on BP log to bring into next appointment.  Plan: Hold amlodipine  for a few days. If swelling improves, restart amlodipine  at 5 mg daily. Reach out to [facility] on Friday if swelling does not improve Continue losartan  50 mg Take BP at home with monitor and write down BP Follow up later this week about swelling. Follow up appointment will be decided then    Jenkins Graces, PharmD PGY1 Pharmacy Resident [phone]   Thank you  Stone Spirito D Leanor Voris, Pharm.JONETTA SARAN, CPP [facility] [address], [place], [place] [address]  Phone: [phone]; Fax: [phone]

## 2024-02-13 NOTE — Patient Instructions (Signed)
 Your blood pressure goal is < 130/28mmHg   Hold amlodipine  10 mg for a few days to see if swelling goes down. If swelling improves, resume amlodipine  5 mg (half a tablet).   Continue taking losartan  50 mg daily.  Continue monitoring your blood pressure two times a day (morning and evening). Write them down on your blood pressure log. Bring this log in to your next PharmD appointment.  Important lifestyle changes to control high blood pressure  Intervention  Effect on the BP   Weight loss Weight loss is one of the most effective lifestyle changes for controlling blood pressure. If you're overweight or obese, losing even a small amount of weight can help reduce blood pressure.    Blood pressure can decrease by 1 millimeter of mercury (mmHg) with each kilogram (about 2.2 pounds) of weight lost.   Exercise regularly As a general goal, aim for 30 minutes of moderate physical activity every day.    Regular physical activity can lower blood pressure by 5 - 8 mmHg.   Eat a healthy diet Eat a diet rich in whole grains, fruits, vegetables, lean meat, and low-fat dairy products. Limit processed foods, saturated fat, and sweets.    A heart-healthy diet can lower high blood pressure by 10 mmHg.   Reduce salt (sodium) in your diet Aim for 000mg  of sodium each day. Avoid deli meats, canned food, and frozen microwave meals which are high in sodium.     Limiting sodium can reduce blood pressure by 5 mmHg.   Limit alcohol One drink equals 12 ounces of beer, 5 ounces of wine, or 1.5 ounces of 80-proof liquor.    Limiting alcohol to < 1 drink a day for women or < 2 drinks a day for men can help lower blood pressure by about 4 mmHg.   To check your pressure at home you will need to:   Sit up in a chair, with feet flat on the floor and back supported. Do not cross your ankles or legs. Rest your left arm so that the cuff is about heart level. If the cuff goes on your upper arm, then just relax  your arm on the table, arm of the chair, or your lap. If you have a wrist cuff, hold your wrist against your chest at heart level. Place the cuff snugly around your arm, about 1 inch above the crease of your elbow. The cords should be inside the groove of your elbow.  Sit quietly, with the cuff in place, for about 5 minutes. Then press the power button to start a reading. Do not talk or move while the reading is taking place.  Record your readings on a sheet of paper. Although most cuffs have a memory, it is often easier to see a pattern developing when the numbers are all in front of you.  You can repeat the reading after 1-3 minutes if it is recommended.   Make sure your bladder is empty and you have not had caffeine or tobacco within the last 30 minutes   Always bring your blood pressure log with you to your appointments. If you have not brought your monitor in to be double checked for accuracy, please bring it to your next appointment.   You can find a list of validated (accurate) blood pressure cuffs at: validatebp.org

## 2024-02-14 DIAGNOSIS — K080 Exfoliation of teeth due to systemic causes: Secondary | ICD-10-CM | POA: Diagnosis not present

## 2024-02-19 ENCOUNTER — Encounter: Payer: Self-pay | Admitting: Physician Assistant

## 2024-02-19 ENCOUNTER — Ambulatory Visit: Attending: Physician Assistant | Admitting: Physician Assistant

## 2024-02-19 VITALS — BP 170/70 | HR 76 | Ht 62.0 in | Wt 156.0 lb

## 2024-02-19 DIAGNOSIS — I10 Essential (primary) hypertension: Secondary | ICD-10-CM | POA: Diagnosis not present

## 2024-02-19 DIAGNOSIS — I700 Atherosclerosis of aorta: Secondary | ICD-10-CM | POA: Diagnosis not present

## 2024-02-19 DIAGNOSIS — E785 Hyperlipidemia, unspecified: Secondary | ICD-10-CM | POA: Diagnosis not present

## 2024-02-19 MED ORDER — LOSARTAN POTASSIUM 50 MG PO TABS: 50.0000 mg | ORAL_TABLET | Freq: Two times a day (BID) | ORAL | 3 refills | Status: DC

## 2024-02-19 NOTE — Patient Instructions (Addendum)
 Medication Instructions:  Increase Losartan  50 mg take one tablet twice daily *If you need a refill on your cardiac medications before your next appointment, please call your pharmacy*  Lab Work: BMET IN TWO WEEKS(03/04/24) If you have labs (blood work) drawn today and your tests are completely normal, you will receive your results only by: MyChart Message (if you have MyChart) OR A paper copy in the mail If you have any lab test that is abnormal or we need to change your treatment, we will call you to review the results.  Testing/Procedures: Your physician has requested that you have an echocardiogram. Echocardiography is a painless test that uses sound waves to create images of your heart. It provides your doctor with information about the size and shape of your heart and how well your heart's chambers and valves are working. This procedure takes approximately one hour. There are no restrictions for this procedure. Please do NOT wear cologne, perfume, aftershave, or lotions (deodorant is allowed). Please arrive 15 minutes prior to your appointment time.  Please note: We ask at that you not bring children with you during ultrasound (echo/ vascular) testing. Due to room size and safety concerns, children are not allowed in the ultrasound rooms during exams. Our front office staff cannot provide observation of children in our lobby area while testing is being conducted. An adult accompanying a patient to their appointment will only be allowed in the ultrasound room at the discretion of the ultrasound technician under special circumstances. We apologize for any inconvenience.   Follow-Up: At [facility], you and your health needs are our priority.  As part of our continuing mission to provide you with exceptional heart care, our providers are all part of one team.  This team includes your primary Cardiologist (physician) and Advanced Practice Providers or APPs (Physician Assistants and  Nurse Practitioners) who all work together to provide you with the care you need, when you need it.  Your next appointment:   2-3 week(s)  Provider:   Almarie Crate in [place]    We recommend signing up for the patient portal called MyChart.  Sign up information is provided on this After Visit Summary.  MyChart is used to connect with patients for Virtual Visits (Telemedicine).  Patients are able to view lab/test results, encounter notes, upcoming appointments, etc.  Non-urgent messages can be sent to your provider as well.   To learn more about what you can do with MyChart, go to [email or web address].

## 2024-02-22 ENCOUNTER — Other Ambulatory Visit

## 2024-02-28 ENCOUNTER — Telehealth: Payer: Self-pay | Admitting: Cardiology

## 2024-02-28 MED ORDER — AMLODIPINE BESYLATE 5 MG PO TABS: 5.0000 mg | ORAL_TABLET | Freq: Every day | ORAL | 3 refills | Status: DC

## 2024-02-28 NOTE — Telephone Encounter (Signed)
 Pt c/o medication issue:  1. Name of Medication:   amLODipine  (NORVASC ) 10 MG tablet    2. How are you currently taking this medication (dosage and times per day)? Take 1 tablet (10 mg total) by mouth daily.Patient taking differently: Take 5 mg by mouth daily.   3. Are you having a reaction (difficulty breathing--STAT)? No  4. What is your medication issue? Pt would like to know if medication dosage can be changed to 5 MG. Pt states it is getting hard for her to cut 10 MG dosage in half. Please advise

## 2024-02-28 NOTE — Telephone Encounter (Signed)
 Parthenia Olivia HERO, PA-C to [facility] Triage  [facility] (Selected Message)     02/28/24  2:56 PM Yes ok to send in Rx for amlodipine  5 mg daily thanks  S/w the patient- gave the information above. RX sent to preferred pharmacy.

## 2024-02-28 NOTE — Telephone Encounter (Signed)
  From OV with Melissa Maccia RPH-CPP- 02/13/24 Plan: Hold amlodipine  for a few days. If swelling improves, restart amlodipine  at 5 mg daily.  Patient Also seen 02/19/24 by Olivia Pavy, PA-C, and was made aware that patient is taking 5 mg.   Is it ok to prescribe the patient 5 mg, patient says it is hard to cut in half.   Will forward to provider and PharmD

## 2024-03-06 ENCOUNTER — Ambulatory Visit: Attending: Physician Assistant

## 2024-03-06 DIAGNOSIS — I10 Essential (primary) hypertension: Secondary | ICD-10-CM | POA: Diagnosis not present

## 2024-03-06 LAB — ECHOCARDIOGRAM COMPLETE
AR max vel: 2.27 cm2
AV Peak grad: 10.6 mmHg
Ao pk vel: 1.63 m/s
Area-P 1/2: 2.71 cm2
Calc EF: 69 %
S' Lateral: 2.2 cm
Single Plane A2C EF: 68.6 %
Single Plane A4C EF: 66.8 %

## 2024-03-07 ENCOUNTER — Ambulatory Visit: Payer: Self-pay | Admitting: Physician Assistant

## 2024-03-12 ENCOUNTER — Ambulatory Visit: Admitting: Nurse Practitioner

## 2024-03-25 LAB — LAB REPORT - SCANNED: EGFR: 72

## 2024-04-01 ENCOUNTER — Ambulatory Visit: Attending: Nurse Practitioner | Admitting: Nurse Practitioner

## 2024-04-01 ENCOUNTER — Encounter: Payer: Self-pay | Admitting: Nurse Practitioner

## 2024-04-01 VITALS — BP 162/80 | HR 72 | Ht 62.0 in | Wt 159.0 lb

## 2024-04-01 DIAGNOSIS — I700 Atherosclerosis of aorta: Secondary | ICD-10-CM | POA: Diagnosis not present

## 2024-04-01 DIAGNOSIS — Z79899 Other long term (current) drug therapy: Secondary | ICD-10-CM

## 2024-04-01 DIAGNOSIS — Z758 Other problems related to medical facilities and other health care: Secondary | ICD-10-CM

## 2024-04-01 DIAGNOSIS — I10 Essential (primary) hypertension: Secondary | ICD-10-CM

## 2024-04-01 DIAGNOSIS — E785 Hyperlipidemia, unspecified: Secondary | ICD-10-CM | POA: Diagnosis not present

## 2024-04-01 MED ORDER — TELMISARTAN 20 MG PO TABS: 20.0000 mg | ORAL_TABLET | Freq: Every day | ORAL | 3 refills | Status: DC

## 2024-04-01 NOTE — Progress Notes (Signed)
 Cardiology Office Note   Date:  04/01/2024  ID:  Miranda Decker, DOB Oct 02, 1955, MRN [id number] PCP: Patient, No Pcp Per  [facility] Providers Cardiologist:  Peter Jordan, MD     History of Present Illness Miranda Decker is a 68 y.o. female with a PMH of HTN, HLD, and aortic atherosclerosis, who presents today for 2-3 week follow-up.   Last seen by Olivia Pavy, PA-C on 02/19/2024. Was previously seen by pharmacy HTN clinic, noted to have leg swelling and amlodipine  held x several days, then decreased to 5 mg daily. Still noted to have some leg edema, could not tolerate hydralazine  as this caused numbness/swelling. Coreg  caused fatigue. Was watching salt intake and noted to have a lot of stress at the time, noted to be on her feet all day working at [employer]. Losartan  increased to 100 mg daily.  Here for follow-up. She states she has noticed elevated BP readings despite medication changes made last office visit. Tells me about her past medication intolerances and chart updated.  I have updated her allergy list-see chart. Does confirm with me that she does not have a PCP.  Tolerating current medications well. Denies any chest pain, shortness of breath, palpitations, syncope, presyncope, dizziness, orthopnea, PND, swelling or significant weight changes, acute bleeding, or claudication.   Surgical history: 2 neck surgery/disc surgeries a long time ago.  She had 3 C-sections all with her 3 children.   Family history: Brother is living and has high blood pressure as well as high cholesterol.  Maternal grandmother had stroke at 44 years old, she is deceased.  No other family history of CVA/MI or CVD.  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   ROS: Negative. See HPI.   Studies Reviewed  Echo 02/2024:  1. Left ventricular ejection fraction, by estimation, is 65 to 70%. The  left ventricle has normal function. The  left ventricle has no regional  wall motion abnormalities. There is mild concentric left ventricular  hypertrophy. Left ventricular diastolic  parameters are indeterminate.   2. Right ventricular systolic function is normal. The right ventricular  size is normal. Tricuspid regurgitation signal is inadequate for assessing  PA pressure.   3. The mitral valve is grossly normal. Trivial mitral valve  regurgitation.   4. The aortic valve is tricuspid. Aortic valve regurgitation is not  visualized. No aortic stenosis is present.   5. The inferior vena cava is normal in size with greater than 50%  respiratory variability, suggesting right atrial pressure of 3 mmHg.   Comparison(s): No prior Echocardiogram.  Renal artery duplex 11/2023:  Normal study  Risk Assessment/Calculations  HYPERTENSION CONTROL Vitals:   04/01/24 0819 04/01/24 0826  BP: (!) 160/90 (!) 162/80    The patient's blood pressure is elevated above target today.  In order to address the patient's elevated BP: A current anti-hypertensive medication was adjusted today.; Blood pressure will be monitored at home to determine if medication changes need to be made.; Follow up with general cardiology has been recommended.          Physical Exam VS:  BP (!) 162/80   Pulse 72   Ht 5' 2 (1.575 m)   Wt 159 lb (72.1 kg)   SpO2 100%   BMI 29.08 kg/m   Repeat BP on exam: 172/93      Wt Readings from Last 3 Encounters:  04/01/24 159 lb (72.1 kg)  02/19/24 156 lb (70.8 kg)  11/13/23 160 lb (72.6 kg)    GEN: Well nourished, well developed in no acute distress NECK: No JVD; No carotid bruits CARDIAC: S1/S2, RRR, no murmurs, rubs, gallops RESPIRATORY:  Clear to auscultation without rales, wheezing or rhonchi  ABDOMEN: Soft, non-tender, non-distended EXTREMITIES:  No edema; No deformity   ASSESSMENT AND PLAN  HTN, medication management Blood pressure is not at goal.  GDMT limited due to intolerances to carvedilol  and  hydralazine  as well as lisinopril-see allergy list.  Instructed her to stop losartan  and will begin telmisartan 20 mg daily tomorrow and bring her back in 1 week for BP check.  If BP is not at goal, plan to increase telmisartan to 40 mg daily.  Will obtain BMET in 1 to 2 weeks. Discussed to monitor BP at home at least 2 hours after medications and sitting for 5-10 minutes. Heart healthy diet and regular cardiovascular exercise encouraged.   HLD Lipid panel from 4 months ago showed an LDL of 158.  She has intolerance to statin per her report.  I recommended referral to clinical pharmacist to discuss other options, patient declines at this time.  She requests to work on lifestyle modifications.  Plan to address arranging FLP/LFT at next office visit. Heart healthy diet and regular cardiovascular exercise encouraged.   Aortic atherosclerosis  As noted above, patient declines referral to clinical pharmacist and request to work on lifestyle modifications.  She cannot tolerate statins as noted above.  Heart healthy diet and regular cardiovascular exercise encouraged.   4.  Does not have a primary care provider Will place referral for her.   Dispo: Follow-up with MD/APP in 3 months or sooner if anything changes.  Signed, Almarie Crate, NP

## 2024-04-01 NOTE — Patient Instructions (Signed)
 Medication Instructions:  Your physician has recommended you make the following change in your medication:   Stop Taking Losartan   Start Telmisartan 20 mg Daily   Monitor Blood Pressure and bring to next visit.   *If you need a refill on your cardiac medications before your next appointment, please call your pharmacy*  Lab Work: Your physician recommends that you return for lab work in: 1-2 Weeks at [facility]  If you have labs (blood work) drawn today and your tests are completely normal, you will receive your results only by: [employer] (if you have MyChart) OR A paper copy in the mail If you have any lab test that is abnormal or we need to change your treatment, we will call you to review the results.  Testing/Procedures: NONE    Follow-Up: At [facility], you and your health needs are our priority.  As part of our continuing mission to provide you with exceptional heart care, our providers are all part of one team.  This team includes your primary Cardiologist (physician) and Advanced Practice Providers or APPs (Physician Assistants and Nurse Practitioners) who all work together to provide you with the care you need, when you need it.  Your next appointment:   3 month(s)  Provider:   Almarie Crate, NP    We recommend signing up for the patient portal called MyChart.  Sign up information is provided on this After Visit Summary.  MyChart is used to connect with patients for Virtual Visits (Telemedicine).  Patients are able to view lab/test results, encounter notes, upcoming appointments, etc.  Non-urgent messages can be sent to your provider as well.   To learn more about what you can do with MyChart, go to [email or web address].   Other Instructions Thank you for choosing [facility]!      Blood Pressure Record Sheet To take your blood pressure, you will need a blood pressure machine. You may be prescribed one, or you can buy a blood pressure  machine (blood pressure monitor) at your clinic, drug store, or online. When choosing one, look for these features: An automatic monitor that has an arm cuff. A cuff that wraps snugly, but not too tightly, around your upper arm. You should be able to fit only one finger between your arm and the cuff. A device that stores blood pressure reading results. Do not choose a monitor that measures your blood pressure from your wrist or finger. Follow your health care provider's instructions for how to take your blood pressure. To use this form: Get one reading in the morning (a.m.) before you take any medicines. Get one reading in the evening (p.m.) before supper. Take at least two readings with each blood pressure check. This makes sure the results are correct. Wait 1-2 minutes between measurements. Write down the results in the spaces on this form. Repeat this once a week, or as told by your health care provider. Make a follow-up appointment with your health care provider to discuss the results. Blood pressure log Date: _______________________ a.m. _____________________(1st reading) _____________________(2nd reading) p.m. _____________________(1st reading) _____________________(2nd reading) Date: _______________________ a.m. _____________________(1st reading) _____________________(2nd reading) p.m. _____________________(1st reading) _____________________(2nd reading) Date: _______________________ a.m. _____________________(1st reading) _____________________(2nd reading) p.m. _____________________(1st reading) _____________________(2nd reading) Date: _______________________ a.m. _____________________(1st reading) _____________________(2nd reading) p.m. _____________________(1st reading) _____________________(2nd reading) Date: _______________________ a.m. _____________________(1st reading) _____________________(2nd reading) p.m. _____________________(1st reading) _____________________(2nd  reading) This information is not intended to replace advice given to you by your health care provider. Make sure you discuss any  questions you have with your health care provider. Document Revised: 01/28/2021 Document Reviewed: 01/28/2021 Elsevier Patient Education  2024 [employer].

## 2024-04-09 ENCOUNTER — Ambulatory Visit: Attending: Cardiology | Admitting: *Deleted

## 2024-04-09 VITALS — BP 148/82 | HR 74

## 2024-04-09 DIAGNOSIS — I10 Essential (primary) hypertension: Secondary | ICD-10-CM

## 2024-04-09 NOTE — Progress Notes (Unsigned)
 Patient in office for nurse BP check.  148/82  74    Took morning meds around 9:30 am.    States that she has noticed slight dizziness with new medication - Telmisartan & is concerned how she may feel if she has to go up on this dose any.   Patient wanted to make you aware that her BP issues have been similar to her brothers - he is 66 yrs old & only on 80mg  Valsartan & 2.5mg  Amlodipine  & cholesterol medication.  Pretty healthy otherwise.  States they have not been able to tolerate the same meds either.

## 2024-04-11 NOTE — Progress Notes (Signed)
 Patient notified and verbalized understanding.  States she mostly drinks water, no sodas. Suggested she may add in some drinks with electrolytes such as G2, propel, smart water.   States she will be doing her labs tomorrow.

## 2024-04-12 DIAGNOSIS — I10 Essential (primary) hypertension: Secondary | ICD-10-CM | POA: Diagnosis not present

## 2024-04-23 ENCOUNTER — Ambulatory Visit: Payer: Self-pay | Admitting: Nurse Practitioner

## 2024-04-30 ENCOUNTER — Telehealth: Payer: Self-pay | Admitting: Cardiology

## 2024-04-30 NOTE — Telephone Encounter (Signed)
 Pt c/o medication issue:  1. Name of Medication:  telmisartan  (MICARDIS ) 20 MG tablet  2. How are you currently taking this medication (dosage and times per day)?   3. Are you having a reaction (difficulty breathing--STAT)?   4. What is your medication issue?   Patient says Telmisartan  is causing dizziness. She would like to discuss switching back to Losartan  if possible. Please advise.

## 2024-05-02 MED ORDER — LOSARTAN POTASSIUM 50 MG PO TABS: 50.0000 mg | ORAL_TABLET | Freq: Two times a day (BID) | ORAL | Status: AC

## 2024-05-02 NOTE — Telephone Encounter (Signed)
 Pt advised of NP recommendation.  Pt agreeable to plan. She will update us  in a week/so.

## 2024-05-14 ENCOUNTER — Telehealth: Payer: Self-pay | Admitting: Nurse Practitioner

## 2024-05-14 DIAGNOSIS — I1A0 Resistant hypertension: Secondary | ICD-10-CM

## 2024-05-14 NOTE — Telephone Encounter (Signed)
 Patient calling with BP readings from past week. Please advise.   12/8   158/80 HR 70 12/9   148/71 HR 79 12/10   159/81 HR 72 12/11   138/81 HR 68 12/12  146/76 HR 73 12/13  151/87 HR 65 12/14   143/69 HR 74

## 2024-05-16 NOTE — Telephone Encounter (Signed)
 Patient notified - states that she was on Amlodipine  10mg  before but was decreased back to 5mg  due to swelling.  See phone call dated 02/28/2024.

## 2024-05-17 MED ORDER — AMLODIPINE BESYLATE 5 MG PO TABS: 5.0000 mg | ORAL_TABLET | Freq: Every day | ORAL | 3 refills | Status: AC

## 2024-05-17 NOTE — Telephone Encounter (Signed)
 patient says she had profound fatigue with coreg , please advise

## 2024-05-20 NOTE — Telephone Encounter (Signed)
 I spoke with patient and she is agreeable to see PharmD at the [facility] office, referral placed.

## 2024-06-04 ENCOUNTER — Ambulatory Visit: Attending: Internal Medicine

## 2024-06-04 VITALS — BP 142/78 | HR 70 | Ht 62.0 in

## 2024-06-04 DIAGNOSIS — I10 Essential (primary) hypertension: Secondary | ICD-10-CM | POA: Diagnosis not present

## 2024-06-04 NOTE — Progress Notes (Signed)
 Patient stated she reports no SOB, lightheadedness or dizziness. Reports no chest pain. Has been feeling fine. Checks her BP at home stated that it runs about 138-160 top # and 79-85 bottom number. She has taken her medications today. Took her BP 168/82 waited about 5 minutes or so and took again 142/78. Patient reported no symptoms advised her will route to [facility] for review.

## 2024-06-12 NOTE — Progress Notes (Signed)
 Patient ID: Miranda Decker                 DOB: 28-Aug-1955                      MRN: [id number]      HPI: Bonne Whack is a 69 y.o. female referred by Dr. Jordan to HTN clinic. PMH is significant for HTN and HLD.   Pharmacy Clinic:  12/25/23: Home readings of 120-125/75 with HR in the 60s. The patient reported dizziness and fatigue. Hydralazine  and carvedilol  were discontinued, and losartan  and amlodipine  were continued.   01/03/2024: She reported one episode of dizziness and swelling. No medication changes were made.   02/13/2024: At-home BP monitoring was checked and was accurate with in-clinic readings. She reported swelling in her ankles, and amlodipine  was held for a few days and restarted at 5 mg daily.   At the most recent visit with Almarie Crate, NP, on 04/01/24, the patient's blood pressure remained elevated at 162/80. She was instructed to stop losartan  and initiate telmisartan  20 mg daily. At the follow-up blood pressure check (04/09/24), BP was elevated at 148/82, and the patient reported dizziness with the new medication, and she was instructed to stop telmisartan  and start losartan  50 mg BID.   At today's visit, the patient reports at-home blood pressure readings ranging between 165-132 / 82-79 mmHg. She did a trial of telmisartan  20 mg daily a couple months ago but reports dizziness and vertigo while taking the medication. She states that she was taking it consistently for 2 weeks but did not see any changes in the side effects. She was previously on amlodipine  10 mg daily but experienced peripheral edema while taking it. Since reducing the dose to 5 mg daily, she has not experienced any further swelling. She is amenable to taking an additional 2.5 mg tablet of amlodipine  in combination with her 5 mg tablet. She is currently taking her blood pressure twice daily, but states that she is fatigued with checking it so often.  Current HTN meds:  Amlodipine  5 mg daily Losartan  50 mg  BID  Previously tried:  Carvedilol  (fatigue) Hydralazine  (numbness, swelling) Lisinopril (passed out) Telmisartan  (dizziness, vertigo) Hydrochlorothiazide  (dizziness)  BP goal: <130/80  Family History:  Family History  Problem Relation Age of Onset   Heart murmur Mother     Social History:  Tobacco: never  EtOH: none   Diet: 3 meals per day Eats veggies, fruits, chicken (baked or pan cooked with olive oil), fish Beverages: 8 glasses of water daily, 1 decaf coffee in the morning, green tea, no sodas or tea  Exercise: Walking in the morning; stretching    Wt Readings from Last 3 Encounters:  04/01/24 159 lb (72.1 kg)  02/19/24 156 lb (70.8 kg)  11/13/23 160 lb (72.6 kg)   BP Readings from Last 3 Encounters:  06/13/24 (!) 162/82  06/04/24 (!) 142/78  04/09/24 (!) 148/82   Pulse Readings from Last 3 Encounters:  06/13/24 74  06/04/24 70  04/09/24 74   Renal function: CrCl cannot be calculated (Patient's most recent lab result is older than the maximum 21 days allowed.).  Past Medical History:  Diagnosis Date   Hypertension     Medications Ordered Prior to Encounter[1]  Allergies[2]  Blood pressure (!) 162/82, pulse 74.   Assessment/Plan:  1. Hypertension -  Hypertension Assessment:  Blood pressure is not at goal. Her at-home and clinic readings remain elevated. She does have a  history of intolerances to multiple antihypertensive medications, but upon further discussion, she is willing to add on a 2.5 mg tablet of amlodipine  in combination with her 5 mg tablet that she currently takes.  She is currently taking her blood pressure twice daily but reports fatigue with checking it so often. Discussed decreasing frequency to once daily and writing down the reading along with the time it was taken, and bringing that log to her next visit.   Plan:  Start taking 2.5 mg amlodipine  in the evening and continue taking 5 mg tablet in the morning Continue taking  losartan  50 mg twice daily  Counseled on checking her blood pressure once daily and write down the reading the time that you take you blood pressure.  She is schedule to follow-up with Almarie Crate 2/3 and [facility] in [place] on Feburary 19th at 9:30AM since its much closer to her house. Could consider 12.5mg  of hydrochlorothiazide  at next visit if needed   Thank you, Woodie Jock, PharmD PGY1 Pharmacy Resident  06/13/2024  Eleanor JONETTA Crews, Pharm.JONETTA SARAN, CPP [facility] A Division of [facility] [address], [place], [place] [address]  Phone: [phone]; Fax: [phone]      [1]  Current Outpatient Medications on File Prior to Visit  Medication Sig Dispense Refill   amLODipine  (NORVASC ) 5 MG tablet Take 1 tablet (5 mg total) by mouth daily. 90 tablet 3   losartan  (COZAAR ) 50 MG tablet Take 1 tablet (50 mg total) by mouth 2 (two) times daily.     vitamin B-12 (VITAMIN B12) 500 MCG tablet Take 1 tablet (500 mcg total) by mouth daily.     No current facility-administered medications on file prior to visit.  [2]  Allergies Allergen Reactions   Lisinopril Other (See Comments)    Passes out    Other Other (See Comments)    4 other bp medications Vertigo, off balance, dizziness, disoriented   Coreg  [Carvedilol ] Other (See Comments)    Fatigue   Hydralazine  Nausea Only    Numbness/ swelling.

## 2024-06-13 ENCOUNTER — Ambulatory Visit: Attending: Cardiology | Admitting: Pharmacist

## 2024-06-13 VITALS — BP 162/82 | HR 74

## 2024-06-13 DIAGNOSIS — I10 Essential (primary) hypertension: Secondary | ICD-10-CM

## 2024-06-13 MED ORDER — AMLODIPINE BESYLATE 2.5 MG PO TABS: 2.5000 mg | ORAL_TABLET | Freq: Every day | ORAL | 3 refills | Status: AC

## 2024-06-13 NOTE — Patient Instructions (Addendum)
 START taking 2.5 mg amlodipine  in the evening and continue taking 5 mg tablet in the morning Continue taking losartan  50 mg twice daily   Please check your blood pressure once daily and write down the reading the time that you take you blood pressure.   Your next visit will be with [facility] in [place] on Feburary 19th at 9:30AM  Your blood pressure goal is < 130/29mmHg  Important lifestyle changes to control high blood pressure  Intervention  Effect on the BP   Weight loss Weight loss is one of the most effective lifestyle changes for controlling blood pressure. If you're overweight or obese, losing even a small amount of weight can help reduce blood pressure.    Blood pressure can decrease by 1 millimeter of mercury (mmHg) with each kilogram (about 2.2 pounds) of weight lost.   Exercise regularly As a general goal, aim for 30 minutes of moderate physical activity every day.    Regular physical activity can lower blood pressure by 5 - 8 mmHg.   Eat a healthy diet Eat a diet rich in whole grains, fruits, vegetables, lean meat, and low-fat dairy products. Limit processed foods, saturated fat, and sweets.    A heart-healthy diet can lower high blood pressure by 10 mmHg.   Reduce salt (sodium) in your diet Aim for 000mg  of sodium each day. Avoid deli meats, canned food, and frozen microwave meals which are high in sodium.     Limiting sodium can reduce blood pressure by 5 mmHg.   Limit alcohol One drink equals 12 ounces of beer, 5 ounces of wine, or 1.5 ounces of 80-proof liquor.    Limiting alcohol to < 1 drink a day for women or < 2 drinks a day for men can help lower blood pressure by about 4 mmHg.   To check your pressure at home you will need to:   Sit up in a chair, with feet flat on the floor and back supported. Do not cross your ankles or legs. Rest your left arm so that the cuff is about heart level. If the cuff goes on your upper arm, then just relax your arm on the  table, arm of the chair, or your lap. If you have a wrist cuff, hold your wrist against your chest at heart level. Place the cuff snugly around your arm, about 1 inch above the crease of your elbow. The cords should be inside the groove of your elbow.  Sit quietly, with the cuff in place, for about 5 minutes. Then press the power button to start a reading. Do not talk or move while the reading is taking place.  Record your readings on a sheet of paper. Although most cuffs have a memory, it is often easier to see a pattern developing when the numbers are all in front of you.  You can repeat the reading after 1-3 minutes if it is recommended.   Make sure your bladder is empty and you have not had caffeine or tobacco within the last 30 minutes   Always bring your blood pressure log with you to your appointments. If you have not brought your monitor in to be double checked for accuracy, please bring it to your next appointment.   You can find a list of validated (accurate) blood pressure cuffs at: validatebp.org

## 2024-06-13 NOTE — Assessment & Plan Note (Addendum)
 Assessment:  Blood pressure is not at goal. Her at-home and clinic readings remain elevated. She does have a history of intolerances to multiple antihypertensive medications, but upon further discussion, she is willing to add on a 2.5 mg tablet of amlodipine  in combination with her 5 mg tablet that she currently takes.  She is currently taking her blood pressure twice daily but reports fatigue with checking it so often. Discussed decreasing frequency to once daily and writing down the reading along with the time it was taken, and bringing that log to her next visit.   Plan:  Start taking 2.5 mg amlodipine  in the evening and continue taking 5 mg tablet in the morning Continue taking losartan  50 mg twice daily  Counseled on checking her blood pressure once daily and write down the reading the time that you take you blood pressure.  She is schedule to follow-up with Almarie Crate 2/3 and [facility] in [place] on Feburary 19th at 9:30AM since its much closer to her house. Could consider 12.5mg  of hydrochlorothiazide  at next visit if needed

## 2024-07-02 ENCOUNTER — Ambulatory Visit: Admitting: Nurse Practitioner

## 2024-07-04 ENCOUNTER — Ambulatory Visit

## 2024-07-18 ENCOUNTER — Ambulatory Visit
